# Patient Record
Sex: Female | Born: 1990 | Race: Black or African American | Hispanic: Yes | Marital: Married | Smoking: Never smoker
Health system: Southern US, Community
[De-identification: ages and names within clinical notes are randomized; demographics above are authoritative.]

## PROBLEM LIST (undated history)

## (undated) DIAGNOSIS — R51 Headache: Secondary | ICD-10-CM

## (undated) DIAGNOSIS — T7840XA Allergy, unspecified, initial encounter: Secondary | ICD-10-CM

## (undated) DIAGNOSIS — K219 Gastro-esophageal reflux disease without esophagitis: Secondary | ICD-10-CM

## (undated) DIAGNOSIS — R519 Headache, unspecified: Secondary | ICD-10-CM

## (undated) DIAGNOSIS — R011 Cardiac murmur, unspecified: Secondary | ICD-10-CM

## (undated) DIAGNOSIS — M1990 Unspecified osteoarthritis, unspecified site: Secondary | ICD-10-CM

## (undated) DIAGNOSIS — K589 Irritable bowel syndrome without diarrhea: Secondary | ICD-10-CM

## (undated) DIAGNOSIS — F445 Conversion disorder with seizures or convulsions: Secondary | ICD-10-CM

## (undated) DIAGNOSIS — R569 Unspecified convulsions: Secondary | ICD-10-CM

## (undated) HISTORY — DX: Headache: R51

## (undated) HISTORY — DX: Cardiac murmur, unspecified: R01.1

## (undated) HISTORY — DX: Irritable bowel syndrome, unspecified: K58.9

## (undated) HISTORY — DX: Unspecified osteoarthritis, unspecified site: M19.90

## (undated) HISTORY — DX: Allergy, unspecified, initial encounter: T78.40XA

## (undated) HISTORY — DX: Gastro-esophageal reflux disease without esophagitis: K21.9

## (undated) HISTORY — DX: Headache, unspecified: R51.9

---

## 2015-07-07 ENCOUNTER — Encounter: Payer: Self-pay | Admitting: *Deleted

## 2015-07-07 ENCOUNTER — Emergency Department
Admission: EM | Admit: 2015-07-07 | Discharge: 2015-07-08 | Disposition: A | Discharge status: Home / Self Care | Payer: No Typology Code available for payment source | Attending: Emergency Medicine | Admitting: Emergency Medicine

## 2015-07-07 DIAGNOSIS — R569 Unspecified convulsions: Secondary | ICD-10-CM | POA: Diagnosis not present

## 2015-07-07 DIAGNOSIS — R4182 Altered mental status, unspecified: Secondary | ICD-10-CM | POA: Diagnosis present

## 2015-07-07 LAB — CBC
HCT: 37.3 % (ref 35.0–47.0)
Hemoglobin: 11.8 g/dL — ABNORMAL LOW (ref 12.0–16.0)
MCH: 25.7 pg — ABNORMAL LOW (ref 26.0–34.0)
MCHC: 31.5 g/dL — ABNORMAL LOW (ref 32.0–36.0)
MCV: 81.8 fL (ref 80.0–100.0)
Platelets: 382 10*3/uL (ref 150–440)
RBC: 4.56 MIL/uL (ref 3.80–5.20)
RDW: 18.6 % — ABNORMAL HIGH (ref 11.5–14.5)
WBC: 11.8 10*3/uL — ABNORMAL HIGH (ref 3.6–11.0)

## 2015-07-07 MED ORDER — DIPHENHYDRAMINE HCL 50 MG/ML IJ SOLN
50.0000 mg | Freq: Once | INTRAMUSCULAR | Status: AC
  Administered 2015-07-08: 50 mg via INTRAVENOUS
  Filled 2015-07-07: qty 1

## 2015-07-07 NOTE — ED Provider Notes (Signed)
[facility] Emergency Department Provider Note  Time seen: 11:44 PM  I have reviewed the triage vital signs and the nursing notes.   HISTORY  Chief Complaint Altered Mental Status    HPI Ruth Haley is a 24 y.o. female with no past medical history status post NSVD 06/28/15 who presents the emergency department with seizure-like activity. According to the patient for the past 1-2 hours she has been having spasm-like episodes. While speaking with the patient she occasionally grimaces her face, and will have shaking like movements of her extremities although she is able to converse throughout the seizure-like activity. Patient states a similar event happened with her last child after she was given an epidural however states she did not have an epidural during this pregnancy. Patient states they never figured out what the issue was with her last child. States she has not had any issues since then, has been very normal including today until approximately 1-2 hours ago. Patient takes fenugreek for breastmilk supplementation, Motrin, and MiraLAX. Patient does breast-feed.     History reviewed. No pertinent past medical history.  There are no active problems to display for this patient.   History reviewed. No pertinent past surgical history.  No current outpatient prescriptions on file.  Allergies Review of patient's allergies indicates no known allergies.  History reviewed. No pertinent family history.  Social History Social History  Substance Use Topics  . Smoking status: Never Smoker   . Smokeless tobacco: None  . Alcohol Use: No    Review of Systems Constitutional: Negative for fever Cardiovascular: Negative for chest pain. Respiratory: Negative for shortness of breath. Gastrointestinal: Negative for abdominal pain Genitourinary: Negative for dysuria. Musculoskeletal: Negative for back pain. Neurological: Negative for headache 10-point ROS otherwise  negative.  ____________________________________________   PHYSICAL EXAM:  VITAL SIGNS: ED Triage Vitals  Enc Vitals Group     BP 07/07/15 2247 130/74 mmHg     Pulse Rate 07/07/15 2247 70     Resp --      Temp 07/07/15 2247 98.6 F (37 C)     Temp Source 07/07/15 2247 Oral     SpO2 07/07/15 2247 100 %     Weight 07/07/15 2247 250 lb (113.399 kg)     Height 07/07/15 2247 5\' 2"  (1.575 m)     Head Cir --      Peak Flow --      Pain Score --      Pain Loc --      Pain Edu? --      Excl. in GC? --     Constitutional: Alert and oriented. Well appearing and in no distress, however will occasionally start shaking in her extremities and tense up her muscles. Occasionally has difficulty speaking, she will relax to get deep breath and then continue speaking. Eyes: Normal exam, PERRL. ENT   Head: Normocephalic and atraumatic   Mouth/Throat: Mucous membranes are moist. Cardiovascular: Normal rate, regular rhythm. No murmur Respiratory: Normal respiratory effort without tachypnea nor retractions. Breath sounds are clear and equal bilaterally. No wheezes/rales/rhonchi. Gastrointestinal: Soft and nontender. No distention.  Musculoskeletal: Nontender with normal range of motion in all extremities. Neurologic:  At times the patient has normal fluent speech and language. Able to move all extremities. Good grip strengths bilaterally. At times patient will occasionally shake extremities, and tense up her muscles, with difficulty speaking, this resolves within several seconds and she feels back to normal. Skin:  Skin is warm, dry and intact.  Psychiatric: Mood and affect are normal. Speech and behavior are normal.   ____________________________________________    INITIAL IMPRESSION / ASSESSMENT AND PLAN / ED COURSE  Pertinent labs & imaging results that were available during my care of the patient were reviewed by me and considered in my medical decision making (see chart for  details).  Patient presents the emergency department with seizure-like activity for the last 1-2 hours. Patient recently delivered vaginally 06/28/15. No complications with the pregnancy or delivery per patient. Patient seizure-like activity is shaking her extremities, and then her muscles tense up and she has difficulty speaking. This lasts for several seconds and then goes away. The patient is able to talk and follow commands throughout the episodes. Does not appear to be consistent with seizure activity. Patient will occasionally have her one arm move that just to cover her mouth, she then grabs that arm with her other hand and pulls it off her mouth. Somewhat strange activity but appears to have control over her movements. We will check labs, dose IV Benadryl, and closely monitor in the emergency department. We will attempt to obtain records from [facility] which she delivered her child for any further information.  I reviewed the patient's records from [facility], do not see any indication in the notes of any seizure-like activity during this recent admission or her last admission for C-section in 2015. We will continue to closely monitor the patient while awaiting lab results.  Patient is feeling much better after Benadryl, is speaking without any difficulty. I discussed with the patient my desire for her to see his psychiatrist, at this time the patient states she does not wish to stay in the emergency department overnight as she has a newborn child at home. Patient's mother is here with the child who states she will take care of the patient at home, watch her closely and they will return if the patient has any further episodes. I do not believe the patient meets IVC criteria at this time, she is alert, well, and although she may benefit from speaking to his psychiatrist I do not believe that it is emergently necessary at this time. I discussed with the patient if any further episodes occur she is to return to the  emergency department, she is agreeable. Medically her labs have resulted in largely normal results. She is unable to give a urine at this time, and does not wish to wait to provide a urine sample. Patient does have a mildly elevated blood pressure, normal LFTs, normal platelets. I discussed with the patient the need to follow up with OB/GYN.  ____________________________________________   FINAL CLINICAL IMPRESSION(S) / ED DIAGNOSES  Non-epileptiform seizure activity   Minna Antis, MD 07/08/15 [phone]

## 2015-07-07 NOTE — ED Notes (Addendum)
Pt to ED via  EMS due to AMS at approx 10pm with seizure like activity. Per EMS upon arrival to home pt began to twitch and have seizure like activity, yet AAOx4 able to answer questions, neuro status fully intact. Upon arrival, pt still AAOX4, full body twitching but still able to talk and look at RN while twitching occuring. Pt gave birth 11/21 at [facility], vaginal birth with no complications. Per pt and mother, pt had "similar episode after last birth and the last epidural" Pt denies having epidural this past birth. Pt no medical hx, takes no meds. Vitals wnl at this time. Pt denies SI, HI, or hallucinations.

## 2015-07-08 LAB — COMPREHENSIVE METABOLIC PANEL
ALT: 17 U/L (ref 14–54)
AST: 22 U/L (ref 15–41)
Albumin: 3.2 g/dL — ABNORMAL LOW (ref 3.5–5.0)
Alkaline Phosphatase: 78 U/L (ref 38–126)
Anion gap: 8 (ref 5–15)
BUN: 17 mg/dL (ref 6–20)
CO2: 22 mmol/L (ref 22–32)
Calcium: 8.8 mg/dL — ABNORMAL LOW (ref 8.9–10.3)
Chloride: 110 mmol/L (ref 101–111)
Creatinine, Ser: 0.73 mg/dL (ref 0.44–1.00)
GFR calc Af Amer: >60 mL/min (ref >60)
GFR calc non Af Amer: >60 mL/min (ref >60)
Glucose, Bld: 89 mg/dL (ref 65–99)
Potassium: 3.8 mmol/L (ref 3.5–5.1)
Sodium: 140 mmol/L (ref 135–145)
Total Bilirubin: <0.1 mg/dL — ABNORMAL LOW (ref 0.3–1.2)
Total Protein: 6.4 g/dL — ABNORMAL LOW (ref 6.5–8.1)

## 2015-07-08 NOTE — Discharge Instructions (Signed)
Please follow-up with your OB/GYN physician as soon as possible for recheck. Return to the emergency department for any further episodes, or any other symptoms personally concerning to your self.    Nonepileptic Seizures Nonepileptic seizures are seizures that are not caused by abnormal electrical signals in your brain. These seizures often seem like epileptic seizures, but they are not caused by epilepsy.  There are two types of nonepileptic seizures:  A physiologic nonepileptic seizure results from a disruption in your brain.  A psychogenic seizure results from emotional stress. These seizures are sometimes called pseudoseizures. CAUSES  Causes of physiologic nonepileptic seizures include:   Sudden drop in blood pressure.  Low blood sugar.  Low levels of salt (sodium) in your blood.  Low levels of calcium in your blood.  Migraine.  Heart rhythm problems.  Sleep disorders.  Drug and alcohol abuse. Common causes of psychogenic nonepileptic seizures include:  Stress.  Emotional trauma.  Sexual or physical abuse.  Major life events, such as divorce or the death of a loved one.  Mental health disorders, including panic attack and hyperactivity disorder. SIGNS AND SYMPTOMS A nonepileptic seizure can look like an epileptic seizure, including uncontrollable shaking (convulsions), or changes in attention, behavior, or the ability to remain awake and alert. However, there are some differences. Nonepileptic seizures usually:  Do not cause physical injuries.  Start slowly.  Include crying or shrieking.  Last longer than 2 minutes.  Have a short recovery time without headache or exhaustion. DIAGNOSIS  Your health care provider can usually diagnose nonepileptic seizures after taking your medical history and giving you a physical exam. Your health care provider may want to talk to your friends or relatives who have seen you have a seizure.  You may also need to have tests to  look for causes of physiologic nonepileptic seizures. This may include an electroencephalogram (EEG), which is a test that measures electrical activity in your brain. If you have had an epileptic seizure, the results of your EEG will be abnormal. If your health care provider thinks you have had a psychogenic nonepileptic seizure, you may need to see a mental health specialist for an evaluation. TREATMENT  Treatment depends on the type and cause of your seizures.  For physiologic nonepileptic seizures, treatment is aimed at addressing the underlying condition that caused the seizures. These seizures usually stop when the underlying condition is properly treated.  Nonepileptic seizures do not respond to the seizure medicines used to treat epilepsy.  For psychogenic seizures, you may need to work with a mental health specialist. HOME CARE INSTRUCTIONS Home care will depend on the type of nonepileptic seizures you have.   Follow all your health care provider's instructions.  Keep all your follow-up appointments. SEEK MEDICAL CARE IF: You continue to have seizures after treatment. SEEK IMMEDIATE MEDICAL CARE IF:  Your seizures change or become more frequent.  You injure yourself during a seizure.  You have one seizure after another.  You have trouble recovering from a seizure.  You have chest pain or trouble breathing. MAKE SURE YOU:  Understand these instructions.  Will watch your condition.  Will get help right away if you are not doing well or get worse.   This information is not intended to replace advice given to you by your health care provider. Make sure you discuss any questions you have with your health care provider.   Document Released: 09/08/2005 Document Revised: 08/14/2014 Document Reviewed: 05/20/2013 Elsevier Interactive Patient Education [employer]2016 Elsevier Inc.

## 2015-10-22 ENCOUNTER — Other Ambulatory Visit: Payer: Self-pay | Admitting: Neurology

## 2015-10-22 DIAGNOSIS — R404 Transient alteration of awareness: Secondary | ICD-10-CM

## 2015-11-04 ENCOUNTER — Ambulatory Visit (INDEPENDENT_AMBULATORY_CARE_PROVIDER_SITE_OTHER): Payer: Managed Care, Other (non HMO) | Admitting: Licensed Clinical Social Worker

## 2015-11-04 ENCOUNTER — Encounter: Payer: Self-pay | Admitting: Licensed Clinical Social Worker

## 2015-11-04 DIAGNOSIS — F445 Conversion disorder with seizures or convulsions: Secondary | ICD-10-CM | POA: Insufficient documentation
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
## 2015-11-12 ENCOUNTER — Ambulatory Visit: Payer: Managed Care, Other (non HMO)

## 2015-12-09 ENCOUNTER — Ambulatory Visit: Payer: Managed Care, Other (non HMO) | Attending: Neurology

## 2017-10-23 ENCOUNTER — Ambulatory Visit: Payer: Self-pay | Admitting: Medical

## 2017-10-23 VITALS — BP 99/53 | HR 76 | Temp 97.9°F | Resp 18 | Ht 62.0 in | Wt 163.0 lb

## 2017-10-23 DIAGNOSIS — J0110 Acute frontal sinusitis, unspecified: Secondary | ICD-10-CM

## 2017-10-23 DIAGNOSIS — H6503 Acute serous otitis media, bilateral: Secondary | ICD-10-CM

## 2017-10-23 DIAGNOSIS — R05 Cough: Secondary | ICD-10-CM

## 2017-10-23 DIAGNOSIS — R059 Cough, unspecified: Secondary | ICD-10-CM

## 2017-10-23 DIAGNOSIS — J069 Acute upper respiratory infection, unspecified: Secondary | ICD-10-CM

## 2017-10-23 MED ORDER — BENZONATATE 100 MG PO CAPS: ORAL_CAPSULE | ORAL | 0 refills | Status: DC

## 2017-10-23 MED ORDER — AMOXICILLIN-POT CLAVULANATE 875-125 MG PO TABS: 1.0000 | ORAL_TABLET | Freq: Two times a day (BID) | ORAL | 0 refills | Status: DC

## 2017-10-23 NOTE — Patient Instructions (Signed)
Upper Respiratory Infection, Adult Most upper respiratory infections (URIs) are caused by a virus. A URI affects the nose, throat, and upper air passages. The most common type of URI is often called "the common cold." Follow these instructions at home:  Take medicines only as told by your doctor.  Gargle warm saltwater or take cough drops to comfort your throat as told by your doctor.  Use a warm mist humidifier or inhale steam from a shower to increase air moisture. This may make it easier to breathe.  Drink enough fluid to keep your pee (urine) clear or pale yellow.  Eat soups and other clear broths.  Have a healthy diet.  Rest as needed.  Go back to work when your fever is gone or your doctor says it is okay. ? You may need to stay home longer to avoid giving your URI to others. ? You can also wear a face mask and wash your hands often to prevent spread of the virus.  Use your inhaler more if you have asthma.  Do not use any tobacco products, including cigarettes, chewing tobacco, or electronic cigarettes. If you need help quitting, ask your doctor. Contact a doctor if:  You are getting worse, not better.  Your symptoms are not helped by medicine.  You have chills.  You are getting more short of breath.  You have brown or red mucus.  You have yellow or brown discharge from your nose.  You have pain in your face, especially when you bend forward.  You have a fever.  You have puffy (swollen) neck glands.  You have pain while swallowing.  You have white areas in the back of your throat. Get help right away if:  You have very bad or constant: ? Headache. ? Ear pain. ? Pain in your forehead, behind your eyes, and over your cheekbones (sinus pain). ? Chest pain.  You have long-lasting (chronic) lung disease and any of the following: ? Wheezing. ? Long-lasting cough. ? Coughing up blood. ? A change in your usual mucus.  You have a stiff neck.  You have  changes in your: ? Vision. ? Hearing. ? Thinking. ? Mood. This information is not intended to replace advice given to you by your health care provider. Make sure you discuss any questions you have with your health care provider. Document Released: 01/10/2008 Document Revised: 03/26/2016 Document Reviewed: 10/29/2013 Elsevier Interactive Patient Education  2018 [employer] Sinusitis, Adult Sinusitis is soreness and inflammation of your sinuses. Sinuses are hollow spaces in the bones around your face. They are located:  Around your eyes.  In the middle of your forehead.  Behind your nose.  In your cheekbones.  Your sinuses and nasal passages are lined with a stringy fluid (mucus). Mucus normally drains out of your sinuses. When your nasal tissues get inflamed or swollen, the mucus can get trapped or blocked so air cannot flow through your sinuses. This lets bacteria, viruses, and funguses grow, and that leads to infection. Follow these instructions at home: Medicines  Take, use, or apply over-the-counter and prescription medicines only as told by your doctor. These may include nasal sprays.  If you were prescribed an antibiotic medicine, take it as told by your doctor. Do not stop taking the antibiotic even if you start to feel better. Hydrate and Humidify  Drink enough water to keep your pee (urine) clear or pale yellow.  Use a cool mist humidifier to keep the humidity level in your home above   50%.  Breathe in steam for 10-15 minutes, 3-4 times a day or as told by your doctor. You can do this in the bathroom while a hot shower is running.  Try not to spend time in cool or dry air. Rest  Rest as much as possible.  Sleep with your head raised (elevated).  Make sure to get enough sleep each night. General instructions  Put a warm, moist washcloth on your face 3-4 times a day or as told by your doctor. This will help with discomfort.  Wash your hands often with soap and  water. If there is no soap and water, use hand sanitizer.  Do not smoke. Avoid being around people who are smoking (secondhand smoke).  Keep all follow-up visits as told by your doctor. This is important. Contact a doctor if:  You have a fever.  Your symptoms get worse.  Your symptoms do not get better within 10 days. Get help right away if:  You have a very bad headache.  You cannot stop throwing up (vomiting).  You have pain or swelling around your face or eyes.  You have trouble seeing.  You feel confused.  Your neck is stiff.  You have trouble breathing. This information is not intended to replace advice given to you by your health care provider. Make sure you discuss any questions you have with your health care provider. Document Released: 01/10/2008 Document Revised: 03/19/2016 Document Reviewed: 05/19/2015 Elsevier Interactive Patient Education  2018 [employer] Otitis Media, Adult Otitis media is redness, soreness, and puffiness (swelling) in the space just behind your eardrum (middle ear). It may be caused by allergies or infection. It often happens along with a cold. Follow these instructions at home:  Take your medicine as told. Finish it even if you start to feel better.  Only take over-the-counter or prescription medicines for pain, discomfort, or fever as told by your doctor.  Follow up with your doctor as told. Contact a doctor if:  You have otitis media only in one ear, or bleeding from your nose, or both.  You notice a lump on your neck.  You are not getting better in 3-5 days.  You feel worse instead of better. Get help right away if:  You have pain that is not helped with medicine.  You have puffiness, redness, or pain around your ear.  You get a stiff neck.  You cannot move part of your face (paralysis).  You notice that the bone behind your ear hurts when you touch it. This information is not intended to replace advice given to you by  your health care provider. Make sure you discuss any questions you have with your health care provider. Document Released: 01/10/2008 Document Revised: 12/30/2015 Document Reviewed: 02/18/2013 Elsevier Interactive Patient Education  2017 [employer]

## 2017-10-23 NOTE — Progress Notes (Signed)
   Subjective:    Patient ID: Ruth Haley, female    DOB: 10/30/1990, 27 y.o.   MRN: [id number]  HPI 27 yo female  In non acute. Comes in today with 3 weeks  with head cold , chest congestion and fatigue, out work for  4 days. . Household  With son and significant other with fever.  Continues with green productive cough , nasal discharge green , wetness in ears or trouble hearing.  Had fever last Wednesday at  101 next evening, no fever since then. No body aches.  Review of Systems  Constitutional: Positive for chills, fatigue and fever (101 last Wednesday).  HENT: Positive for congestion, ear discharge (both), ear pain (both right hurts more), rhinorrhea, sinus pressure, sinus pain (forehead), sneezing, sore throat and trouble swallowing (due to pain and mucus "a little bit"). Negative for tinnitus and voice change.   Eyes: Negative for discharge and itching.  Respiratory: Positive for cough. Negative for chest tightness, shortness of breath and wheezing.   Cardiovascular: Negative for chest pain.  Gastrointestinal: Negative for abdominal pain.  Genitourinary: Negative for dysuria.  Musculoskeletal: Negative for myalgias.  Skin: Negative for rash.  Allergic/Immunologic: Positive for environmental allergies (spring allergies fall) and food allergies (lactose intolerant).  Neurological: Positive for headaches. Negative for dizziness, syncope and light-headedness.  Hematological: Positive for adenopathy.  Psychiatric/Behavioral: Negative for behavioral problems, self-injury and suicidal ideas. The patient is not nervous/anxious.        Objective:   Physical Exam  Constitutional: She is oriented to person, place, and time. She appears well-developed and well-nourished.  HENT:  Head: Normocephalic and atraumatic.  Right Ear: Hearing, external ear and ear canal normal. Tympanic membrane is erythematous.  Left Ear: Hearing and ear canal normal. Tympanic membrane is erythematous.  Nose:  Mucosal edema and rhinorrhea present.  Mouth/Throat: Uvula is midline and mucous membranes are normal. Posterior oropharyngeal edema and posterior oropharyngeal erythema present.  Eyes: Conjunctivae and EOM are normal. Pupils are equal, round, and reactive to light.  Neck: Normal range of motion. Neck supple.  Cardiovascular: Normal rate, regular rhythm and normal heart sounds.  Pulmonary/Chest: Effort normal and breath sounds normal.  Lymphadenopathy:    She has cervical adenopathy.  Neurological: She is alert and oriented to person, place, and time.  Skin: Skin is warm and dry.  Psychiatric: She has a normal mood and affect. Her behavior is normal. Judgment and thought content normal.  Nursing note and vitals reviewed.   Cough noted in room.      Assessment & Plan:  Otitis media bilateral, Sinusitis, Upper Respiratory Infection Meds ordered this encounter  Medications  . benzonatate (TESSALON PERLES) 100 MG capsule    Sig: 1-2 perles swallow whole every 8 hours as needed for cough    Dispense:  30 capsule    Refill:  0  . amoxicillin-clavulanate (AUGMENTIN) 875-125 MG tablet    Sig: Take 1 tablet by mouth 2 (two) times daily.    Dispense:  20 tablet    Refill:  0  Increase fluids, use cough drops and OTC Motrin or Tylenol as needed for pain in throat, take as directed.Dilute salt water gargles.  Follow up in 3-5 days if not improving . Patient verbalizes understanding and has no questions at discharge.

## 2019-10-15 ENCOUNTER — Encounter: Payer: Self-pay | Admitting: Medical

## 2019-10-15 ENCOUNTER — Other Ambulatory Visit: Payer: Self-pay

## 2019-10-15 ENCOUNTER — Ambulatory Visit: Payer: Self-pay | Admitting: Medical

## 2019-10-15 VITALS — BP 121/70 | HR 47 | Temp 98.0°F | Resp 18 | Ht 62.0 in | Wt 176.0 lb

## 2019-10-15 DIAGNOSIS — J302 Other seasonal allergic rhinitis: Secondary | ICD-10-CM

## 2019-10-15 DIAGNOSIS — H6501 Acute serous otitis media, right ear: Secondary | ICD-10-CM

## 2019-10-15 DIAGNOSIS — H60501 Unspecified acute noninfective otitis externa, right ear: Secondary | ICD-10-CM

## 2019-10-15 DIAGNOSIS — H6983 Other specified disorders of Eustachian tube, bilateral: Secondary | ICD-10-CM

## 2019-10-15 MED ORDER — NEOMYCIN-POLYMYXIN-HC 3.5-10000-1 OT SOLN: 4.0000 [drp] | Freq: Four times a day (QID) | OTIC | 0 refills | Status: AC

## 2019-10-15 MED ORDER — AMOXICILLIN 875 MG PO TABS: 875.0000 mg | ORAL_TABLET | Freq: Two times a day (BID) | ORAL | 0 refills | Status: DC

## 2019-10-15 NOTE — Patient Instructions (Signed)
Otitis Externa  Otitis externa is an infection of the outer ear canal. The outer ear canal is the area between the outside of the ear and the eardrum. Otitis externa is sometimes called swimmer's ear. What are the causes? Common causes of this condition include:  Swimming in dirty water.  Moisture in the ear.  An injury to the inside of the ear.  An object stuck in the ear.  A cut or scrape on the outside of the ear. What increases the risk? You are more likely to get this condition if you go swimming often. What are the signs or symptoms?  Itching in the ear. This is often the first symptom.  Swelling of the ear.  Redness in the ear.  Ear pain. The pain may get worse when you pull on your ear.  Pus coming from the ear. How is this treated? This condition may be treated with:  Antibiotic ear drops. These are often given for 10-14 days.  Medicines to reduce itching and swelling. Follow these instructions at home:  If you were given antibiotic ear drops, use them as told by your doctor. Do not stop using them even if your condition gets better.  Take over-the-counter and prescription medicines only as told by your doctor.  Avoid getting water in your ears as told by your doctor. You may be told to avoid swimming or water sports for a few days.  Keep all follow-up visits as told by your doctor. This is important. How is this prevented?  Keep your ears dry. Use the corner of a towel to dry your ears after you swim or bathe.  Try not to scratch or put things in your ear. Doing these things makes it easier for germs to grow in your ear.  Avoid swimming in lakes, dirty water, or pools that may not have the right amount of a chemical called chlorine. Contact a doctor if:  You have a fever.  Your ear is still red, swollen, or painful after 3 days.  You still have pus coming from your ear after 3 days.  Your redness, swelling, or pain gets worse.  You have a really  bad headache.  You have redness, swelling, pain, or tenderness behind your ear. Summary  Otitis externa is an infection of the outer ear canal.  Symptoms include pain, redness, and swelling of the ear.  If you were given antibiotic ear drops, use them as told by your doctor. Do not stop using them even if your condition gets better.  Try not to scratch or put things in your ear. This information is not intended to replace advice given to you by your health care provider. Make sure you discuss any questions you have with your health care provider. Document Revised: 12/28/2017 Document Reviewed: 12/28/2017 Elsevier Patient Education  2020 [employer] Inc. Otitis Media, Adult  Otitis media means that the middle ear is red and swollen (inflamed) and full of fluid. The condition usually goes away on its own. Follow these instructions at home:  Take over-the-counter and prescription medicines only as told by your doctor.  If you were prescribed an antibiotic medicine, take it as told by your doctor. Do not stop taking the antibiotic even if you start to feel better.  Keep all follow-up visits as told by your doctor. This is important. Contact a doctor if:  You have bleeding from your nose.  There is a lump on your neck.  You are not getting better in 5   days.  You feel worse instead of better. Get help right away if:  You have pain that is not helped with medicine.  You have swelling, redness, or pain around your ear.  You get a stiff neck.  You cannot move part of your face (paralyzed).  You notice that the bone behind your ear hurts when you touch it.  You get a very bad headache. Summary  Otitis media means that the middle ear is red, swollen, and full of fluid.  This condition usually goes away on its own. In some cases, treatment may be needed.  If you were prescribed an antibiotic medicine, take it as told by your doctor. This information is not intended to replace  advice given to you by your health care provider. Make sure you discuss any questions you have with your health care provider. Document Revised: 07/06/2017 Document Reviewed: 08/14/2016 Elsevier Patient Education  2020 [employer].

## 2019-10-15 NOTE — Progress Notes (Signed)
   Subjective:    Patient ID: Ruth Haley, female    DOB: 02-23-1991, 29 y.o.   MRN: [id number]  HPI  29 yo female in non acute distress. History of  2-3 weeks of right ear pain, now the left side ear is painful and now a slight sore throat. No cough. Nasal congestion and nothing comes out of the nose.  History of seasonal allegies: Allergies reciewed with patient. Patient states she is not allergic to [facility] but her mother is .  Blood pressure 121/70, pulse (!) 47, temperature 98 F (36.7 C), resp. rate 18, height 5\' 2"  (1.575 m), weight 176 lb (79.8 kg), last menstrual period 10/15/2019, SpO2 100 %.  Review of Systems  HENT: Positive for ear pain. Negative for congestion, ear discharge, postnasal drip, sinus pressure, sinus pain and sore throat.   All other systems reviewed and are negative.      Objective:   Physical Exam HENT:     Head: Normocephalic and atraumatic.     Right Ear: External ear normal. There is no impacted cerumen.     Left Ear: Ear canal and external ear normal. There is no impacted cerumen.     Mouth/Throat:     Mouth: Mucous membranes are moist.     Pharynx: Posterior oropharyngeal erythema present. No oropharyngeal exudate.  Eyes:     Extraocular Movements: Extraocular movements intact.     Conjunctiva/sclera: Conjunctivae normal.     Pupils: Pupils are equal, round, and reactive to light.  Cardiovascular:     Rate and Rhythm: Normal rate and regular rhythm.  Pulmonary:     Effort: Pulmonary effort is normal.     Breath sounds: Normal breath sounds.  Musculoskeletal:        General: Normal range of motion.     Cervical back: Normal range of motion and neck supple.  Lymphadenopathy:     Cervical: No cervical adenopathy.  Skin:    General: Skin is warm and dry.     Capillary Refill: Capillary refill takes less than 2 seconds.  Neurological:     General: No focal deficit present.     Mental Status: She is oriented to person, place, and time.   Psychiatric:        Mood and Affect: Mood normal.        Behavior: Behavior normal.        Thought Content: Thought content normal.        Judgment: Judgment normal.           Assessment & Plan:  Otitis externa right Otitis media right Eustachian tube dysfunction bilateral Seasonal allergies OTC Zyrtec and OTC Flonase take as directed. Meds ordered this encounter  Medications  . neomycin-polymyxin-hydrocortisone (CORTISPORIN) OTIC solution    Sig: Place 4 drops into the right ear 4 (four) times daily for 7 days.    Dispense:  10 mL    Refill:  0  . amoxicillin (AMOXIL) 875 MG tablet    Sig: Take 1 tablet (875 mg total) by mouth 2 (two) times daily.    Dispense:  20 tablet    Refill:  0  return in 3-5 days if not improving. Patient verbalizes understanding and has no  questions at discharge.

## 2019-10-23 ENCOUNTER — Encounter: Payer: Self-pay | Admitting: Medical

## 2020-05-10 ENCOUNTER — Encounter: Payer: Self-pay | Admitting: Emergency Medicine

## 2020-05-10 ENCOUNTER — Emergency Department
Admission: EM | Admit: 2020-05-10 | Discharge: 2020-05-10 | Disposition: A | Discharge status: Home / Self Care | Payer: BC Managed Care – PPO | Attending: Emergency Medicine | Admitting: Emergency Medicine

## 2020-05-10 ENCOUNTER — Other Ambulatory Visit: Payer: Self-pay

## 2020-05-10 DIAGNOSIS — Y92838 Other recreation area as the place of occurrence of the external cause: Secondary | ICD-10-CM | POA: Insufficient documentation

## 2020-05-10 DIAGNOSIS — Y93B9 Activity, other involving muscle strengthening exercises: Secondary | ICD-10-CM | POA: Diagnosis not present

## 2020-05-10 DIAGNOSIS — W228XXA Striking against or struck by other objects, initial encounter: Secondary | ICD-10-CM | POA: Diagnosis not present

## 2020-05-10 DIAGNOSIS — S0993XA Unspecified injury of face, initial encounter: Secondary | ICD-10-CM | POA: Diagnosis not present

## 2020-05-10 DIAGNOSIS — S01511A Laceration without foreign body of lip, initial encounter: Secondary | ICD-10-CM | POA: Diagnosis not present

## 2020-05-10 DIAGNOSIS — Z87891 Personal history of nicotine dependence: Secondary | ICD-10-CM | POA: Diagnosis not present

## 2020-05-10 HISTORY — DX: Unspecified convulsions: R56.9

## 2020-05-10 MED ORDER — LIDOCAINE-EPINEPHRINE-TETRACAINE (LET) TOPICAL GEL
3.0000 mL | Freq: Once | TOPICAL | Status: AC
  Administered 2020-05-10: 3 mL via TOPICAL
  Filled 2020-05-10: qty 3

## 2020-05-10 NOTE — ED Notes (Signed)
ED Provider at bedside. 

## 2020-05-10 NOTE — ED Provider Notes (Signed)
[facility] Emergency Department Provider Note ____________________________________________   First MD Initiated Contact with Patient 05/10/20 [phone]     (approximate)  I have reviewed the triage vital signs and the nursing notes.  HISTORY  Chief Complaint Lip Laceration   HPI Ruth Haley is a 29 y.o. femalewho presents to the ED for evaluation of an accidental left laceration.  Chart review indicates no relevant medical history.  Takes no thinners.  Patient reports being in her typical state of health until accidentally striking herself in the face with a medicine ball during an exercise routine.  She reports this just happened last night.  She denies additional trauma, subsequent falls, syncope or head trauma beyond the medicine ball hitting her in the face/chin.  She reports a laceration to the mucosal margin of the lower lip, that is now hemostatic with triage gauze.   She reports her teeth feel aligned, she is able to mobilize her mandible with some right TMJ pain.  She reports aching right TMJ pain, 5/10 intensity with opening of her mouth that is constant and nonradiating.  She has not taken any medications prior to arrival.  Past Medical History:  Diagnosis Date  . Headache   . Seizures [facility])     Patient Active Problem List   Diagnosis Date Noted  . 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 CESAREAN SECTION      Prior to Admission medications   Medication Sig Start Date End Date Taking? Authorizing Provider  amoxicillin (AMOXIL) 875 MG tablet Take 1 tablet (875 mg total) by mouth 2 (two) times daily. 10/15/19   Ratcliffe, Heather R, PA-C  amoxicillin-clavulanate (AUGMENTIN) 875-125 MG tablet Take 1 tablet by mouth 2 (two) times daily. 10/23/17   Ratcliffe, Heather R, PA-C  benzonatate (TESSALON PERLES) 100 MG capsule 1-2 perles  swallow whole every 8 hours as needed for cough 10/23/17   Ratcliffe, Heather R, PA-C  PARAGARD INTRAUTERINE COPPER IUD IUD 1 Product by Intrauterine route See admin instructions.    [provider]    Allergies Adhesive [tape] and Mangifera indica  Family History  Problem Relation Age of Onset  . Crohn's disease Father   . Colitis Father   . Congenital heart disease Father   . Leukemia Father   . Arthritis/Rheumatoid Father   . Lung disease Father     Social History Social History   Tobacco Use  . Smoking status: Former Games developer  . Smokeless tobacco: Never Used  Substance Use Topics  . Alcohol use: No  . Drug use: No    Review of Systems  Constitutional: No fever/chills Eyes: No visual changes. ENT: No sore throat.  Positive for intraoral trauma and TMJ pain Cardiovascular: Denies chest pain. Respiratory: Denies shortness of breath. Gastrointestinal: No abdominal pain.  No nausea, no vomiting.  No diarrhea.  No constipation. Genitourinary: Negative for dysuria. Musculoskeletal: Negative for back pain. Skin: Negative for rash. Neurological: Negative for headaches, focal weakness or numbness.  ____________________________________________   PHYSICAL EXAM:  VITAL SIGNS: Vitals:   05/10/20 0649 05/10/20 0956  BP: 118/86 121/78  Pulse: 86 87  Resp: 18 16  Temp: (!) 97.5 F (36.4 C)   SpO2: 99% 100%      Constitutional: Alert and oriented. Well appearing and in no acute distress. Eyes: Conjunctivae are normal. PERRL. EOMI. Head: Scattered superficial abrasions to the chin/mentum.  No bony  step-offs or discrete laceration externally. Mild and poorly localizing right TMJ tenderness without overlying skin changes or signs of trauma.  No bony step-offs.  Patient is able to widely open her mouth, but reports right TMJ discomfort while doing this. Nose: No congestion/rhinnorhea. Mouth/Throat: Mucous membranes are moist.  Oropharynx non-erythematous. Isolated  single laceration to the mucosal portion of the lower lip, obliquely oriented, about 1 cm in length and gaping.  Not full-thickness, no external lacerations.  No other intraoral lesions noted.  Her teeth are well aligned.  No loose teeth. Neck: No stridor. No cervical spine tenderness to palpation. Cardiovascular: Normal rate, regular rhythm. Grossly normal heart sounds.  Good peripheral circulation. Respiratory: Normal respiratory effort.  No retractions. Lungs CTAB. Gastrointestinal: Soft , nondistended, nontender to palpation. No abdominal bruits. No CVA tenderness. Musculoskeletal: No lower extremity tenderness nor edema.  No joint effusions. No signs of acute trauma. Neurologic:  Normal speech and language. No gross focal neurologic deficits are appreciated. No gait instability noted. Skin:  Skin is warm, dry and intact. No rash noted. Psychiatric: Mood and affect are normal. Speech and behavior are normal. ____________________________________________   PROCEDURES and INTERVENTIONS  Procedure(s) performed (including Critical Care):  Marland KitchenMarland KitchenLaceration Repair  Date/Time: 05/10/2020 11:45 AM Performed by: Delton Prairie, MD Authorized by: Delton Prairie, MD   Consent:    Consent obtained:  Verbal   Consent given by:  Patient   Risks discussed:  Pain, poor cosmetic result and infection   Alternatives discussed:  No treatment Anesthesia (see MAR for exact dosages):    Anesthesia method:  Topical application   Topical anesthetic:  LET Laceration details:    Location:  Lip   Lip location:  Lower interior lip   Length (cm):  1 Repair type:    Repair type:  Simple Exploration:    Hemostasis achieved with:  Direct pressure   Wound exploration: wound explored through full range of motion     Contaminated: no   Treatment:    Amount of cleaning:  Standard   Visualized foreign bodies/material removed: no   Skin repair:    Repair method:  Sutures   Suture size:  5-0   Wound skin closure  material used: monocryl.   Suture technique:  Simple interrupted   Number of sutures:  1 Post-procedure details:    Dressing:  Open (no dressing)   Patient tolerance of procedure:  Tolerated well, no immediate complications    Medications  lidocaine-EPINEPHrine-tetracaine (LET) topical gel (3 mLs Topical Given 05/10/20 0920)    ____________________________________________   MDM / ED COURSE  Otherwise healthy 29 year old female presents to the ED for evaluation after accidentally striking her face with a exercise medicine ball, causing a lip laceration requiring single suture for repair, and amenable to outpatient management.  Normal vital signs on room air.  Exam demonstrates laceration to the mucosal portion of her lower lip, that is gaping and requires suture for reapproximation.  No evidence of full-thickness laceration.  While she has some vague and poorly localizing tenderness around her right TMJ, she has no focal bony tenderness, teeth malalignment, or impaired ROM.  We discussed the possibility of possible nondisplaced fracture there, she declines x-rays as this would not change her management.  We discussed outpatient management as if she does have a possible mandibular fracture, such as soft foods and Tylenol/ibuprofen.  Lip laceration repaired, as above, with single observable suture, and this was well-tolerated.  Provided return precautions for the ED and patient is medically  stable for discharge home.  Clinical Course as of May 10 1609  Mon May 10, 2020  4128 Lac repair complete   [DS]    Clinical Course User Index [DS] Delton Prairie, MD     ____________________________________________   FINAL CLINICAL IMPRESSION(S) / ED DIAGNOSES  Final diagnoses:  Lip laceration, initial encounter  Facial trauma, initial encounter     ED Discharge Orders    None       Larene Ascencio Katrinka Blazing   Note:  This document was prepared using Dragon voice recognition software and may include  unintentional dictation errors.   Delton Prairie, MD 05/10/20 [phone]

## 2020-05-10 NOTE — ED Triage Notes (Signed)
Patient states that she bounces a medicine ball and it hit her in the face. Patient with laceration to lip and abrasion to chin.

## 2020-05-10 NOTE — ED Notes (Signed)
Pt to ED For lac to lip. Lip is swollen, bleeding controlled at this time.

## 2020-05-10 NOTE — Discharge Instructions (Addendum)
Please take Tylenol and ibuprofen/Advil for your pain.  It is safe to take them together, or to alternate them every few hours.  Take up to 1000mg  of Tylenol at a time, up to 4 times per day.  Do not take more than 4000 mg of Tylenol in 24 hours.  For ibuprofen, take 400-600 mg, 4-5 times per day.  The single stitch that we put into your lip will absorb on its own and does not need to be removed.  If you develop the inability to open/close your mouth due to jaw pain, fevers or pus coming from your wounds, please return to the ED.

## 2020-05-31 ENCOUNTER — Other Ambulatory Visit: Payer: Self-pay

## 2020-05-31 ENCOUNTER — Encounter: Payer: Self-pay | Admitting: Medical

## 2020-05-31 ENCOUNTER — Ambulatory Visit: Payer: Self-pay | Admitting: Medical

## 2020-05-31 VITALS — BP 122/78 | HR 89 | Temp 98.3°F | Resp 18 | Ht 62.0 in | Wt 184.4 lb

## 2020-05-31 DIAGNOSIS — H6122 Impacted cerumen, left ear: Secondary | ICD-10-CM

## 2020-05-31 NOTE — Progress Notes (Signed)
   Subjective:    Patient ID: Ruth Haley, female    DOB: 04-30-1991, 29 y.o.   MRN: [id number]  HPI  29 yo in non acute distress. Currently being treated for Concussion and  Right infected ear with Ofloxacin Otic drops. Left  Ear is muffled at times or has no hearing , she wonders if it is wax or another ear infection. Sinus symptoms children sick with runny nose and cough and generally not feeling well, the youngest is runny nose and PND.  Patients sinus symptoms of congestion began on Saturday. She denies any recen or knownt exposure to Covid-19  infection    Revieiwed allergies with patient  Mother with anaphylaxis to [facility] Patient is alllergic to Morphine and tape adhesive  Review of Systems  Constitutional: Negative for chills and fever.  HENT: Positive for congestion and ear discharge (currently being treated for infection with ofloxacin ). Negative for ear pain (loss of hearing left on/ off), postnasal drip, rhinorrhea and sore throat.   Respiratory: Negative for cough, shortness of breath and wheezing.   Cardiovascular: Negative for chest pain.  Gastrointestinal: Negative for abdominal pain, diarrhea, nausea and vomiting.  Musculoskeletal: Negative for myalgias.  Allergic/Immunologic: Positive for environmental allergies.  Neurological: Positive for headaches (currently being treated for concussion being treated by her doctor.).       Objective:   Physical Exam Constitutional:      Appearance: Normal appearance.  HENT:     Head: Normocephalic and atraumatic.     Right Ear: No swelling.     Left Ear: External ear normal. There is impacted cerumen.  Eyes:     Comments: Wearing sunglasses  Musculoskeletal:     Cervical back: Normal range of motion.  Neurological:     Mental Status: She is alert.           Assessment & Plan:  Cerumen impaction left- reassured patient. Recommended OTC  Debrox Otic drops  5-10 drops twice daily x 4 days , return on Thursday for  ear irrigation. Patient verbalizes understanding and has no questions at discharge.

## 2020-05-31 NOTE — Patient Instructions (Signed)
Earwax Buildup, Adult The ears produce a substance called earwax that helps keep bacteria out of the ear and protects the skin in the ear canal. Occasionally, earwax can build up in the ear and cause discomfort or hearing loss. What increases the risk? This condition is more likely to develop in people who:  Are female.  Are elderly.  Naturally produce more earwax.  Clean their ears often with cotton swabs.  Use earplugs often.  Use in-ear headphones often.  Wear hearing aids.  Have narrow ear canals.  Have earwax that is overly thick or sticky.  Have eczema.  Are dehydrated.  Have excess hair in the ear canal. What are the signs or symptoms? Symptoms of this condition include:  Reduced or muffled hearing.  A feeling of fullness in the ear or feeling that the ear is plugged.  Fluid coming from the ear.  Ear pain.  Ear itch.  Ringing in the ear.  Coughing.  An obvious piece of earwax that can be seen inside the ear canal. How is this diagnosed? This condition may be diagnosed based on:  Your symptoms.  Your medical history.  An ear exam. During the exam, your health care provider will look into your ear with an instrument called an otoscope. You may have tests, including a hearing test. How is this treated? This condition may be treated by:  Using ear drops to soften the earwax.  Having the earwax removed by a health care provider. The health care provider may: ? Flush the ear with water. ? Use an instrument that has a loop on the end (curette). ? Use a suction device.  Surgery to remove the wax buildup. This may be done in severe cases. Follow these instructions at home:   Take over-the-counter and prescription medicines only as told by your health care provider.  Do not put any objects, including cotton swabs, into your ear. You can clean the opening of your ear canal with a washcloth or facial tissue.  Follow instructions from your health care  provider about cleaning your ears. Do not over-clean your ears.  Drink enough fluid to keep your urine clear or pale yellow. This will help to thin the earwax.  Keep all follow-up visits as told by your health care provider. If earwax builds up in your ears often or if you use hearing aids, consider seeing your health care provider for routine, preventive ear cleanings. Ask your health care provider how often you should schedule your cleanings.  If you have hearing aids, clean them according to instructions from the manufacturer and your health care provider. Contact a health care provider if:  You have ear pain.  You develop a fever.  You have blood, pus, or other fluid coming from your ear.  You have hearing loss.  You have ringing in your ears that does not go away.  Your symptoms do not improve with treatment.  You feel like the room is spinning (vertigo). Summary  Earwax can build up in the ear and cause discomfort or hearing loss.  The most common symptoms of this condition include reduced or muffled hearing and a feeling of fullness in the ear or feeling that the ear is plugged.  This condition may be diagnosed based on your symptoms, your medical history, and an ear exam.  This condition may be treated by using ear drops to soften the earwax or by having the earwax removed by a health care provider.  Do not put any   objects, including cotton swabs, into your ear. You can clean the opening of your ear canal with a washcloth or facial tissue. This information is not intended to replace advice given to you by your health care provider. Make sure you discuss any questions you have with your health care provider. Document Revised: 07/06/2017 Document Reviewed: 10/04/2016 Elsevier Patient Education  2020 Elsevier Inc.  

## 2020-06-03 ENCOUNTER — Ambulatory Visit: Payer: Self-pay | Admitting: Nurse Practitioner

## 2020-06-03 ENCOUNTER — Other Ambulatory Visit: Payer: Self-pay

## 2020-06-03 VITALS — BP 122/78 | HR 60 | Temp 97.1°F | Resp 16

## 2020-06-03 DIAGNOSIS — H6122 Impacted cerumen, left ear: Secondary | ICD-10-CM

## 2020-06-03 NOTE — Progress Notes (Signed)
   Subjective:    Patient ID: Ruth Haley, female    DOB: 08/24/1990, 29 y.o.   MRN: [id number]  HPI 29 year old female here for examination of left ear canal after using Debrox for two days. She was evaluated prior at [facility] and found to have cerumen impaction on the left. She is currently under treatment for a right ear canal infection and is using abx drops given to her by her PCP.   She also noted that she had two dissolvable stiches placed to her lower lip in the ED after being hit in the face by a medicine ball. She still feels a lump in her lower lip with decreased sensation to her lip.      Review of Systems  Constitutional: Negative.   HENT: Positive for ear discharge.   Respiratory: Negative.   Cardiovascular: Negative.        Objective:   Physical Exam Constitutional:      Appearance: Normal appearance.  HENT:     Right Ear: Tympanic membrane, ear canal and external ear normal.     Left Ear: Hearing and tympanic membrane normal. Drainage present.  Neurological:     Mental Status: She is alert.           Assessment & Plan:  Ear irragation performed by RN, relief of remaining cerumen TM intact patient tolerated well. Advised she may continue Debrox daily as needed prior to showering.  RTC with any new symptoms or concerns. Avoid Qtips

## 2020-08-09 ENCOUNTER — Encounter: Payer: Self-pay | Admitting: Medical

## 2020-08-09 ENCOUNTER — Other Ambulatory Visit: Payer: Self-pay

## 2020-08-09 ENCOUNTER — Ambulatory Visit: Payer: Self-pay | Admitting: Medical

## 2020-08-09 VITALS — BP 124/88 | HR 83 | Temp 98.1°F | Resp 16 | Ht 62.0 in | Wt 189.8 lb

## 2020-08-09 DIAGNOSIS — H6506 Acute serous otitis media, recurrent, bilateral: Secondary | ICD-10-CM

## 2020-08-09 DIAGNOSIS — J029 Acute pharyngitis, unspecified: Secondary | ICD-10-CM

## 2020-08-09 LAB — POCT RAPID STREP A (OFFICE): Rapid Strep A Screen: NEGATIVE

## 2020-08-09 MED ORDER — AZITHROMYCIN 250 MG PO TABS: ORAL_TABLET | ORAL | 0 refills | Status: DC

## 2020-08-09 NOTE — Patient Instructions (Signed)
Otitis Externa  Otitis externa is an infection of the outer ear canal. The outer ear canal is the area between the outside of the ear and the eardrum. Otitis externa is sometimes called swimmer's ear. What are the causes? Common causes of this condition include:  Swimming in dirty water.  Moisture in the ear.  An injury to the inside of the ear.  An object stuck in the ear.  A cut or scrape on the outside of the ear. What increases the risk? You are more likely to get this condition if you go swimming often. What are the signs or symptoms?  Itching in the ear. This is often the first symptom.  Swelling of the ear.  Redness in the ear.  Ear pain. The pain may get worse when you pull on your ear.  Pus coming from the ear. How is this treated? This condition may be treated with:  Antibiotic ear drops. These are often given for 10-14 days.  Medicines to reduce itching and swelling. Follow these instructions at home:  If you were given antibiotic ear drops, use them as told by your doctor. Do not stop using them even if your condition gets better.  Take over-the-counter and prescription medicines only as told by your doctor.  Avoid getting water in your ears as told by your doctor. You may be told to avoid swimming or water sports for a few days.  Keep all follow-up visits as told by your doctor. This is important. How is this prevented?  Keep your ears dry. Use the corner of a towel to dry your ears after you swim or bathe.  Try not to scratch or put things in your ear. Doing these things makes it easier for germs to grow in your ear.  Avoid swimming in lakes, dirty water, or pools that may not have the right amount of a chemical called chlorine. Contact a doctor if:  You have a fever.  Your ear is still red, swollen, or painful after 3 days.  You still have pus coming from your ear after 3 days.  Your redness, swelling, or pain gets worse.  You have a really  bad headache.  You have redness, swelling, pain, or tenderness behind your ear. Summary  Otitis externa is an infection of the outer ear canal.  Symptoms include pain, redness, and swelling of the ear.  If you were given antibiotic ear drops, use them as told by your doctor. Do not stop using them even if your condition gets better.  Try not to scratch or put things in your ear. This information is not intended to replace advice given to you by your health care provider. Make sure you discuss any questions you have with your health care provider. Document Revised: 12/28/2017 Document Reviewed: 12/28/2017 Elsevier Patient Education  2020 [employer]. Otitis Media, Adult  Otitis media means that the middle ear is red and swollen (inflamed) and full of fluid. The condition usually goes away on its own. Follow these instructions at home:  Take over-the-counter and prescription medicines only as told by your doctor.  If you were prescribed an antibiotic medicine, take it as told by your doctor. Do not stop taking the antibiotic even if you start to feel better.  Keep all follow-up visits as told by your doctor. This is important. Contact a doctor if:  You have bleeding from your nose.  There is a lump on your neck.  You are not getting better in 5  days.  You feel worse instead of better. Get help right away if:  You have pain that is not helped with medicine.  You have swelling, redness, or pain around your ear.  You get a stiff neck.  You cannot move part of your face (paralyzed).  You notice that the bone behind your ear hurts when you touch it.  You get a very bad headache. Summary  Otitis media means that the middle ear is red, swollen, and full of fluid.  This condition usually goes away on its own. In some cases, treatment may be needed.  If you were prescribed an antibiotic medicine, take it as told by your doctor. This information is not intended to replace  advice given to you by your health care provider. Make sure you discuss any questions you have with your health care provider. Document Revised: 07/06/2017 Document Reviewed: 08/14/2016 Elsevier Patient Education  2020 [employer].

## 2020-08-09 NOTE — Progress Notes (Signed)
   Subjective:    Patient ID: Ruth Haley, female    DOB: [birth year], 30 y.o.   MRN: [id number]  HPI 30 yo female in non acute distress. Treated for ear infection 06/28/2020 seen by PCP,   Finished Amoxil and Ofxfloxin treatment Seemed to improve. Started symptoms again  08/01/2020.   Tonsils feel big but do not hurt.  Blood pressure 124/88, pulse 83, temperature 98.1 F (36.7 C), temperature source Oral, resp. rate 16, height 5\' 2"  (1.575 m), weight 189 lb 12.8 oz (86.1 kg), last menstrual period 07/07/2020, SpO2 97 %.    Current Outpatient Medications:  .  azithromycin (ZITHROMAX Z-PAK) 250 MG tablet, Take 2 tablets by mouth today then one tablet days 2-5. Take with food., Disp: 6 each, Rfl: 0 .  benzonatate (TESSALON PERLES) 100 MG capsule, 1-2 perles swallow whole every 8 hours as needed for cough (Patient not taking: No sig reported), Disp: 30 capsule, Rfl: 0 .  PARAGARD INTRAUTERINE COPPER IUD IUD, 1 Product by Intrauterine route See admin instructions., Disp: , Rfl:     Allergies  Allergen Reactions  . Mangifera Indica Itching    Mother has the allergy - anaphylaxis  . Morphine And Related     Feels like brain is on fire  . Adhesive [Tape]     Review of Systems  Constitutional: Negative for chills and fever.  HENT: Positive for ear discharge (right) and ear pain (both).   Respiratory: Negative for cough, shortness of breath and wheezing.   Cardiovascular: Negative for chest pain.  Musculoskeletal: Negative for myalgias.       Objective:   Physical Exam Constitutional:      Appearance: Normal appearance.  HENT:     Head: Normocephalic and atraumatic.     Jaw: There is normal jaw occlusion.     Right Ear: Hearing and external ear normal. Swelling present. A middle ear effusion is present. There is impacted cerumen (looks like wax is on the TM, not impacated). No hemotympanum. Tympanic membrane is not injected.     Left Ear: Hearing, ear canal and external ear  normal. No swelling. A middle ear effusion is present. There is impacted cerumen (looks like wax is on the TM not impacted). No hemotympanum. Tympanic membrane is not injected.     Mouth/Throat:     Pharynx: Posterior oropharyngeal erythema present. No oropharyngeal exudate.  Eyes:     Extraocular Movements: Extraocular movements intact.     Conjunctiva/sclera: Conjunctivae normal.     Pupils: Pupils are equal, round, and reactive to light.  Musculoskeletal:     Cervical back: Normal range of motion and neck supple.  Neurological:     Mental Status: She is alert.     STREP Test Negative      Assessment & Plan:  Otitis Media bilateral cerumen noted at the back of canal. Otitis externa right ear canal Continue to use Ofloxin otic drops as prescribed. Referral completed for  [facility] due to 2 ear infections in November and then again today. Meds ordered this encounter  Medications  . azithromycin (ZITHROMAX Z-PAK) 250 MG tablet    Sig: Take 2 tablets by mouth today then one tablet days 2-5. Take with food.    Dispense:  6 each    Refill:  0   Patient verbalizes understanding and has no questions at discharge.

## 2020-11-24 ENCOUNTER — Encounter: Payer: Self-pay | Admitting: Medical

## 2020-11-24 ENCOUNTER — Ambulatory Visit: Payer: BC Managed Care – PPO | Admitting: Medical

## 2020-11-24 ENCOUNTER — Other Ambulatory Visit: Payer: Self-pay

## 2020-11-24 VITALS — BP 110/74 | HR 92 | Temp 98.1°F | Resp 16

## 2020-11-24 DIAGNOSIS — R0981 Nasal congestion: Secondary | ICD-10-CM

## 2020-11-24 DIAGNOSIS — Z20822 Contact with and (suspected) exposure to covid-19: Secondary | ICD-10-CM

## 2020-11-24 DIAGNOSIS — H6502 Acute serous otitis media, left ear: Secondary | ICD-10-CM

## 2020-11-24 LAB — POC COVID19 BINAXNOW: SARS Coronavirus 2 Ag: NEGATIVE

## 2020-11-24 MED ORDER — AMOXICILLIN 875 MG PO TABS: 875.0000 mg | ORAL_TABLET | Freq: Two times a day (BID) | ORAL | 0 refills | Status: DC

## 2020-11-24 NOTE — Progress Notes (Signed)
   Subjective:    Patient ID: Ruth Haley, female    DOB: 12/05/1990, 30 y.o.   MRN: [id number]  HPI 30 yo female in non acute distress presents to day with complaints of ST starting on Monday.. Cold symptoms x 2 weeks assumed it was allergies,  Congestion facial pain and HA.  Thursday Negative Covid-19 at home.  Side of left tonsil  When she swallows it hurts her left ear.  Saw her ENT in January, for" a dbl ear infection"   Blood pressure 110/74, pulse 92, temperature 98.1 F (36.7 C), temperature source Oral, resp. rate 16, last menstrual period 11/24/2020, SpO2 98 %.  Allergies  Allergen Reactions  . Mangifera Indica Itching    Mother has the allergy - anaphylaxis  . Morphine And Related     Feels like brain is on fire  . Adhesive [Tape]     Review of Systems  Constitutional: Negative for chills and fever.  HENT: Positive for ear pain and sore throat (pain on swallowing left side only). Negative for ear discharge and sinus pressure.   Respiratory: Negative for cough.   Cardiovascular: Negative for chest pain.  Gastrointestinal: Negative for abdominal pain and diarrhea.  Allergic/Immunologic: Positive for environmental allergies.  Neurological: Negative for dizziness, light-headedness and headaches.       Objective:   Physical Exam Vitals and nursing note reviewed.  Constitutional:      Appearance: She is well-developed.  HENT:     Head: Normocephalic and atraumatic.     Right Ear: Tympanic membrane and ear canal normal.     Left Ear: Tympanic membrane and ear canal normal.     Mouth/Throat:     Mouth: Mucous membranes are moist. No oral lesions.     Pharynx: Uvula midline. Pharyngeal swelling (mild) and posterior oropharyngeal erythema present. No oropharyngeal exudate or uvula swelling.     Tonsils: 1+ on the right. 1+ on the left.  Eyes:     Conjunctiva/sclera: Conjunctivae normal.     Pupils: Pupils are equal, round, and reactive to light.  Pulmonary:      Effort: Pulmonary effort is normal.  Musculoskeletal:     Cervical back: Normal range of motion and neck supple.  Skin:    General: Skin is warm and dry.     Capillary Refill: Capillary refill takes less than 2 seconds.  Neurological:     General: No focal deficit present.     Mental Status: She is alert and oriented to person, place, and time.  Psychiatric:        Mood and Affect: Mood normal.        Behavior: Behavior normal.          Assessment & Plan:  Tonsillitis Otalgia left Meds ordered this encounter  Medications  . amoxicillin (AMOXIL) 875 MG tablet    Sig: Take 1 tablet (875 mg total) by mouth 2 (two) times daily.    Dispense:  20 tablet    Refill:  0  OTC Motrin (Ibuprofen)  800 mg ( 4 200mg  tablets) three times a day with food. Soft foods nothing acidic.  Return in 3-5 days if not improving or if worsening. Patient verbalizes understanding and has no questions at discharge.

## 2020-11-24 NOTE — Patient Instructions (Signed)
Otitis Media, Adult  Otitis media is a condition in which the middle ear is red and swollen (inflamed) and full of fluid. The middle ear is the part of the ear that contains bones for hearing as well as air that helps send sounds to the brain. The condition usually goes away on its own. What are the causes? This condition is caused by a blockage in the eustachian tube. The eustachian tube connects the middle ear to the back of the nose. It normally allows air into the middle ear. The blockage is caused by fluid or swelling. Problems that can cause blockage include:  A cold or infection that affects the nose, mouth, or throat.  Allergies.  An irritant, such as tobacco smoke.  Adenoids that have become large. The adenoids are soft tissue located in the back of the throat, behind the nose and the roof of the mouth.  Growth or swelling in the upper part of the throat, just behind the nose (nasopharynx).  Damage to the ear caused by change in pressure. This is called barotrauma. What are the signs or symptoms? Symptoms of this condition include:  Ear pain.  Fever.  Problems with hearing.  Being tired.  Fluid leaking from the ear.  Ringing in the ear. How is this treated? This condition can go away on its own within 3-5 days. But if the condition is caused by bacteria or does not go away on its own, or if it keeps coming back, your doctor may:  Give you antibiotic medicines.  Give you medicines for pain. Follow these instructions at home:  Take over-the-counter and prescription medicines only as told by your doctor.  If you were prescribed an antibiotic medicine, take it as told by your doctor. Do not stop taking the antibiotic even if you start to feel better.  Keep all follow-up visits as told by your doctor. This is important. Contact a doctor if:  You have bleeding from your nose.  There is a lump on your neck.  You are not feeling better in 5 days.  You feel worse  instead of better. Get help right away if:  You have pain that is not helped with medicine.  You have swelling, redness, or pain around your ear.  You get a stiff neck.  You cannot move part of your face (paralysis).  You notice that the bone behind your ear hurts when you touch it.  You get a very bad headache. Summary  Otitis media means that the middle ear is red, swollen, and full of fluid.  This condition usually goes away on its own.  If the problem does not go away, treatment may be needed. You may be given medicines to treat the infection or to treat your pain.  If you were prescribed an antibiotic medicine, take it as told by your doctor. Do not stop taking the antibiotic even if you start to feel better.  Keep all follow-up visits as told by your doctor. This is important. This information is not intended to replace advice given to you by your health care provider. Make sure you discuss any questions you have with your health care provider. Document Revised: 06/26/2019 Document Reviewed: 06/26/2019 Elsevier Patient Education  2021 [employer].  

## 2020-11-25 LAB — NOVEL CORONAVIRUS, NAA: SARS-CoV-2, NAA: NOT DETECTED

## 2020-11-25 LAB — SARS-COV-2, NAA 2 DAY TAT

## 2020-11-29 ENCOUNTER — Telehealth: Payer: Self-pay | Admitting: Medical

## 2020-11-29 NOTE — Telephone Encounter (Signed)
Patient placed on Amoxil for Tonsillitis.  She states she is feeling "much much better" and is doing well. Return to the clinic as needed.  Patient verbalizes understanding and has no questions at the end of our conversation.

## 2021-07-13 ENCOUNTER — Other Ambulatory Visit: Payer: Self-pay | Admitting: Nurse Practitioner

## 2021-07-13 DIAGNOSIS — R198 Other specified symptoms and signs involving the digestive system and abdomen: Secondary | ICD-10-CM

## 2021-07-13 DIAGNOSIS — K582 Mixed irritable bowel syndrome: Secondary | ICD-10-CM

## 2021-07-13 DIAGNOSIS — Z8041 Family history of malignant neoplasm of ovary: Secondary | ICD-10-CM

## 2021-07-13 DIAGNOSIS — K625 Hemorrhage of anus and rectum: Secondary | ICD-10-CM

## 2021-07-13 DIAGNOSIS — Z8379 Family history of other diseases of the digestive system: Secondary | ICD-10-CM

## 2021-07-13 DIAGNOSIS — R1084 Generalized abdominal pain: Secondary | ICD-10-CM

## 2021-07-14 ENCOUNTER — Ambulatory Visit
Admission: RE | Admit: 2021-07-14 | Discharge: 2021-07-14 | Disposition: A | Discharge status: Home / Self Care | Payer: BC Managed Care – PPO | Source: Ambulatory Visit | Attending: Nurse Practitioner | Admitting: Nurse Practitioner

## 2021-07-14 ENCOUNTER — Other Ambulatory Visit: Payer: Self-pay

## 2021-07-14 DIAGNOSIS — K582 Mixed irritable bowel syndrome: Secondary | ICD-10-CM | POA: Diagnosis present

## 2021-07-14 DIAGNOSIS — Z8379 Family history of other diseases of the digestive system: Secondary | ICD-10-CM

## 2021-07-14 DIAGNOSIS — Z8041 Family history of malignant neoplasm of ovary: Secondary | ICD-10-CM | POA: Diagnosis present

## 2021-07-14 DIAGNOSIS — K625 Hemorrhage of anus and rectum: Secondary | ICD-10-CM | POA: Diagnosis present

## 2021-07-14 DIAGNOSIS — R1084 Generalized abdominal pain: Secondary | ICD-10-CM | POA: Diagnosis present

## 2021-07-14 DIAGNOSIS — R198 Other specified symptoms and signs involving the digestive system and abdomen: Secondary | ICD-10-CM

## 2021-07-14 MED ORDER — IOHEXOL 300 MG/ML  SOLN
100.0000 mL | Freq: Once | INTRAMUSCULAR | Status: AC | PRN
  Administered 2021-07-14: 100 mL via INTRAVENOUS

## 2021-08-15 DIAGNOSIS — M25552 Pain in left hip: Secondary | ICD-10-CM | POA: Diagnosis not present

## 2021-08-15 DIAGNOSIS — R2689 Other abnormalities of gait and mobility: Secondary | ICD-10-CM | POA: Diagnosis not present

## 2021-08-15 DIAGNOSIS — M5459 Other low back pain: Secondary | ICD-10-CM | POA: Diagnosis not present

## 2021-08-15 DIAGNOSIS — M25551 Pain in right hip: Secondary | ICD-10-CM | POA: Diagnosis not present

## 2021-08-22 DIAGNOSIS — M5459 Other low back pain: Secondary | ICD-10-CM | POA: Diagnosis not present

## 2021-08-22 DIAGNOSIS — M25551 Pain in right hip: Secondary | ICD-10-CM | POA: Diagnosis not present

## 2021-08-22 DIAGNOSIS — R2689 Other abnormalities of gait and mobility: Secondary | ICD-10-CM | POA: Diagnosis not present

## 2021-08-22 DIAGNOSIS — M25552 Pain in left hip: Secondary | ICD-10-CM | POA: Diagnosis not present

## 2021-08-29 DIAGNOSIS — K449 Diaphragmatic hernia without obstruction or gangrene: Secondary | ICD-10-CM | POA: Diagnosis not present

## 2021-08-29 DIAGNOSIS — K582 Mixed irritable bowel syndrome: Secondary | ICD-10-CM | POA: Diagnosis not present

## 2021-08-29 DIAGNOSIS — K625 Hemorrhage of anus and rectum: Secondary | ICD-10-CM | POA: Diagnosis not present

## 2021-08-29 DIAGNOSIS — R1084 Generalized abdominal pain: Secondary | ICD-10-CM | POA: Diagnosis not present

## 2021-08-29 DIAGNOSIS — K219 Gastro-esophageal reflux disease without esophagitis: Secondary | ICD-10-CM | POA: Diagnosis not present

## 2021-08-29 DIAGNOSIS — Z8379 Family history of other diseases of the digestive system: Secondary | ICD-10-CM | POA: Diagnosis not present

## 2021-08-29 DIAGNOSIS — R198 Other specified symptoms and signs involving the digestive system and abdomen: Secondary | ICD-10-CM | POA: Diagnosis not present

## 2021-08-31 DIAGNOSIS — M5459 Other low back pain: Secondary | ICD-10-CM | POA: Diagnosis not present

## 2021-08-31 DIAGNOSIS — M25552 Pain in left hip: Secondary | ICD-10-CM | POA: Diagnosis not present

## 2021-08-31 DIAGNOSIS — M25551 Pain in right hip: Secondary | ICD-10-CM | POA: Diagnosis not present

## 2021-08-31 DIAGNOSIS — R2689 Other abnormalities of gait and mobility: Secondary | ICD-10-CM | POA: Diagnosis not present

## 2021-09-07 DIAGNOSIS — J029 Acute pharyngitis, unspecified: Secondary | ICD-10-CM | POA: Diagnosis not present

## 2021-09-08 DIAGNOSIS — N920 Excessive and frequent menstruation with regular cycle: Secondary | ICD-10-CM | POA: Diagnosis not present

## 2021-09-23 DIAGNOSIS — J020 Streptococcal pharyngitis: Secondary | ICD-10-CM | POA: Diagnosis not present

## 2021-09-23 DIAGNOSIS — R002 Palpitations: Secondary | ICD-10-CM | POA: Diagnosis not present

## 2021-09-23 DIAGNOSIS — J029 Acute pharyngitis, unspecified: Secondary | ICD-10-CM | POA: Diagnosis not present

## 2021-10-03 DIAGNOSIS — R002 Palpitations: Secondary | ICD-10-CM | POA: Diagnosis not present

## 2021-10-13 DIAGNOSIS — R002 Palpitations: Secondary | ICD-10-CM | POA: Diagnosis not present

## 2021-10-15 ENCOUNTER — Other Ambulatory Visit: Payer: Self-pay

## 2021-10-15 ENCOUNTER — Ambulatory Visit
Admission: EM | Admit: 2021-10-15 | Discharge: 2021-10-15 | Disposition: A | Discharge status: Home / Self Care | Payer: BC Managed Care – PPO | Attending: Student | Admitting: Student

## 2021-10-15 DIAGNOSIS — J0391 Acute recurrent tonsillitis, unspecified: Secondary | ICD-10-CM | POA: Diagnosis not present

## 2021-10-15 DIAGNOSIS — H66001 Acute suppurative otitis media without spontaneous rupture of ear drum, right ear: Secondary | ICD-10-CM | POA: Diagnosis not present

## 2021-10-15 MED ORDER — BENZONATATE 100 MG PO CAPS: 100.0000 mg | ORAL_CAPSULE | Freq: Three times a day (TID) | ORAL | 0 refills | Status: DC

## 2021-10-15 MED ORDER — PROMETHAZINE-DM 6.25-15 MG/5ML PO SYRP: 5.0000 mL | ORAL_SOLUTION | Freq: Four times a day (QID) | ORAL | 0 refills | Status: DC | PRN

## 2021-10-15 MED ORDER — FLUCONAZOLE 150 MG PO TABS: 150.0000 mg | ORAL_TABLET | Freq: Every day | ORAL | 0 refills | Status: DC

## 2021-10-15 MED ORDER — AMOXICILLIN 875 MG PO TABS: 875.0000 mg | ORAL_TABLET | Freq: Two times a day (BID) | ORAL | 0 refills | Status: AC

## 2021-10-15 NOTE — ED Triage Notes (Addendum)
Pt present coughing with sore throat, nasal congestion and drainage. Symptom started on Wednesday.  Pt took an at  home covid test Thursday and the results were negative. Pt tried otc medication with no relief.  Pt states having difficulty swallowing.  ?

## 2021-10-15 NOTE — ED Provider Notes (Signed)
[facility]    CSN: [id number] Arrival date & time: 10/15/21  0841      History   Chief Complaint Chief Complaint  Patient presents with   Nasal Congestion   Cough   Sore Throat    HPI Ruth Haley is a 31 y.o. female presenting with nasal congestion, cough, sore throat.  History of recurrent strep throat.  Symptoms for about 4 days.  Has attempted over-the-counter medication without relief.  States significant pain with swallowing.  Cough is nonproductive, denies shortness of breath, chest pain, wheezing.  No history of pulmonary disease.  Right ear pain with muffled hearing, denies dizziness, tinnitus.  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      Home Medications    Prior to Admission medications   Medication Sig Start Date End Date Taking? Authorizing Provider  amoxicillin (AMOXIL) 875 MG tablet Take 1 tablet (875 mg total) by mouth 2 (two) times daily for 7 days. 10/15/21 10/22/21 Yes Rhys MartiniGraham, Lorilei Horan E, PA-C  benzonatate (TESSALON) 100 MG capsule Take 1 capsule (100 mg total) by mouth every 8 (eight) hours. 10/15/21  Yes Rhys MartiniGraham, Starasia Sinko E, PA-C  fluconazole (DIFLUCAN) 150 MG tablet Take 1 tablet (150 mg total) by mouth daily. -For your yeast infection, start the Diflucan (fluconazole)- Take one pill today (day 1). If you're still having symptoms in 3 days, take the second pill. 10/15/21  Yes Rhys MartiniGraham, Blue Winther E, PA-C  promethazine-dextromethorphan (PROMETHAZINE-DM) 6.25-15 MG/5ML syrup Take 5 mLs by mouth 4 (four) times daily as needed for cough. 10/15/21  Yes Rhys MartiniGraham, Mikaiya Tramble E, PA-C  PARAGARD INTRAUTERINE COPPER IUD IUD 1 Product by Intrauterine route See  admin instructions.    [provider]    Family History Family History  Problem Relation Age of Onset   Crohn's disease Father    Colitis Father    Congenital heart disease Father    Leukemia Father    Arthritis/Rheumatoid Father    Lung disease Father     Social History Social History   Tobacco Use   Smoking status: Former   Smokeless tobacco: Never  Substance Use Topics   Alcohol use: No   Drug use: No     Allergies   Mangifera indica, Morphine and related, and Adhesive [tape]   Review of Systems Review of Systems  Constitutional:  Negative for appetite change, chills and fever.  HENT:  Positive for congestion and sore throat. Negative for ear pain, rhinorrhea, sinus pressure and sinus pain.   Eyes:  Negative for redness and visual disturbance.  Respiratory:  Positive for cough. Negative for chest tightness, shortness of breath and wheezing.   Cardiovascular:  Negative for chest pain and palpitations.  Gastrointestinal:  Negative for abdominal pain, constipation, diarrhea, nausea and vomiting.  Genitourinary:  Negative for dysuria, frequency and urgency.  Musculoskeletal:  Negative for myalgias.  Neurological:  Negative for dizziness, weakness and headaches.  Psychiatric/Behavioral:  Negative for confusion.   All other systems reviewed and are negative.   Physical Exam Triage Vital Signs ED Triage Vitals  Enc Vitals Group     BP --  Pulse Rate 10/15/21 0922 85     Resp 10/15/21 0922 16     Temp 10/15/21 0922 98.2 F (36.8 C)     Temp Source 10/15/21 0922 Oral     SpO2 10/15/21 0922 100 %     Weight --      Height --      Head Circumference --      Peak Flow --      Pain Score 10/15/21 0923 6     Pain Loc --      Pain Edu? --      Excl. in GC? --    No data found.  Updated Vital Signs Pulse 85    Temp 98.2 F (36.8 C) (Oral)    Resp 16    SpO2 100%   Visual Acuity Right Eye Distance:   Left Eye Distance:   Bilateral Distance:     Right Eye Near:   Left Eye Near:    Bilateral Near:     Physical Exam Vitals reviewed.  Constitutional:      General: She is not in acute distress.    Appearance: Normal appearance. She is not ill-appearing.  HENT:     Head: Normocephalic and atraumatic.     Right Ear: Ear canal and external ear normal. No tenderness. No middle ear effusion. There is no impacted cerumen. Tympanic membrane is erythematous and bulging. Tympanic membrane is not perforated or retracted.     Left Ear: Tympanic membrane, ear canal and external ear normal. No tenderness.  No middle ear effusion. There is no impacted cerumen. Tympanic membrane is not perforated, erythematous, retracted or bulging.     Nose: Congestion present.     Mouth/Throat:     Mouth: Mucous membranes are moist.     Pharynx: Uvula midline. Posterior oropharyngeal erythema present. No oropharyngeal exudate.     Tonsils: No tonsillar exudate. 1+ on the right. 1+ on the left.     Comments: Tonsils 1+ bilaterally with erythema but no exudate. On exam, uvula is midline, she is tolerating her secretions without difficulty, there is no trismus, no drooling, she has normal phonation  Eyes:     Extraocular Movements: Extraocular movements intact.     Pupils: Pupils are equal, round, and reactive to light.  Cardiovascular:     Rate and Rhythm: Normal rate and regular rhythm.     Heart sounds: Normal heart sounds.  Pulmonary:     Effort: Pulmonary effort is normal.     Breath sounds: Normal breath sounds. No decreased breath sounds, wheezing, rhonchi or rales.  Abdominal:     Palpations: Abdomen is soft.     Tenderness: There is no abdominal tenderness. There is no guarding or rebound.  Lymphadenopathy:     Cervical: No cervical adenopathy.     Right cervical: No superficial cervical adenopathy.    Left cervical: No superficial cervical adenopathy.  Neurological:     General: No focal deficit present.     Mental Status: She is alert and  oriented to person, place, and time.  Psychiatric:        Mood and Affect: Mood normal.        Behavior: Behavior normal.        Thought Content: Thought content normal.        Judgment: Judgment normal.     UC Treatments / Results  Labs (all labs ordered are listed, but only abnormal results are displayed) Labs Reviewed - No data to display  EKG   Radiology No results found.  Procedures Procedures (including critical care time)  Medications Ordered in UC Medications - No data to display  Initial Impression / Assessment and Plan / UC Course  I have reviewed the triage vital signs and the nursing notes.  Pertinent labs & imaging results that were available during my care of the patient were reviewed by me and considered in my medical decision making (see chart for details).     This patient is a very pleasant 31 y.o. year old female presenting with R AOM related to viral syndrome. Afebrile, nontachy. Nontoxic appearing, no adventitious breath sounds. States she is not pregnant or breastfeeding.  Negative home covid test.  History recurrent strep. Centor score 1. Did not check a strep PCR as the Amoxicillin will cover for this.   Amoxicillin, tessalon, promethazine DM sent. Diflucan for yeast prophylaxis.   ED return precautions discussed. Patient verbalizes understanding and agreement.    Final Clinical Impressions(s) / UC Diagnoses   Final diagnoses:  Non-recurrent acute suppurative otitis media of right ear without spontaneous rupture of tympanic membrane  Recurrent tonsillitis     Discharge Instructions      -Amoxicillin twice daily x7 days. Take with food if you have a sensitive stomach.  -Tessalon (Benzonatate) as needed for cough. Take one pill up to 3x daily (every 8 hours) -Promethazine DM cough syrup for congestion/cough. This could make you drowsy, so take at night before bed. -You can take Tylenol up to 1000 mg 3 times daily, and ibuprofen up to 600  mg 3 times daily with food.  You can take these together, or alternate every 3-4 hours. -Flonase nasal steroid: place 2 sprays into both nostrils in the morning and at bedtime for at least 7 days. Continue for longer if this is helping. This medication is over-the-counter.  -With a virus, you're typically contagious for 5-7 days, or as long as you're having fevers.     ED Prescriptions     Medication Sig Dispense Auth. Provider   amoxicillin (AMOXIL) 875 MG tablet Take 1 tablet (875 mg total) by mouth 2 (two) times daily for 7 days. 14 tablet Rhys Martini, PA-C   benzonatate (TESSALON) 100 MG capsule Take 1 capsule (100 mg total) by mouth every 8 (eight) hours. 21 capsule Rhys Martini, PA-C   promethazine-dextromethorphan (PROMETHAZINE-DM) 6.25-15 MG/5ML syrup Take 5 mLs by mouth 4 (four) times daily as needed for cough. 118 mL Rhys Martini, PA-C   fluconazole (DIFLUCAN) 150 MG tablet Take 1 tablet (150 mg total) by mouth daily. -For your yeast infection, start the Diflucan (fluconazole)- Take one pill today (day 1). If you're still having symptoms in 3 days, take the second pill. 2 tablet Rhys Martini, PA-C      PDMP not reviewed this encounter.   Rhys Martini, PA-C 10/15/21 [phone]

## 2021-10-15 NOTE — Discharge Instructions (Addendum)
-  Amoxicillin twice daily x7 days. Take with food if you have a sensitive stomach.  ?-Tessalon (Benzonatate) as needed for cough. Take one pill up to 3x daily (every 8 hours) ?-Promethazine DM cough syrup for congestion/cough. This could make you drowsy, so take at night before bed. ?-You can take Tylenol up to 1000 mg 3 times daily, and ibuprofen up to 600 mg 3 times daily with food.  You can take these together, or alternate every 3-4 hours. ?-Flonase nasal steroid: place 2 sprays into both nostrils in the morning and at bedtime for at least 7 days. Continue for longer if this is helping. This medication is over-the-counter.  ?-With a virus, you're typically contagious for 5-7 days, or as long as you're having fevers.  ? ?

## 2021-10-20 DIAGNOSIS — Z30433 Encounter for removal and reinsertion of intrauterine contraceptive device: Secondary | ICD-10-CM | POA: Diagnosis not present

## 2021-10-21 DIAGNOSIS — R002 Palpitations: Secondary | ICD-10-CM | POA: Diagnosis not present

## 2021-10-21 DIAGNOSIS — J029 Acute pharyngitis, unspecified: Secondary | ICD-10-CM | POA: Diagnosis not present

## 2021-10-21 DIAGNOSIS — G43009 Migraine without aura, not intractable, without status migrainosus: Secondary | ICD-10-CM | POA: Diagnosis not present

## 2021-10-21 DIAGNOSIS — H6693 Otitis media, unspecified, bilateral: Secondary | ICD-10-CM | POA: Diagnosis not present

## 2021-10-24 DIAGNOSIS — E669 Obesity, unspecified: Secondary | ICD-10-CM | POA: Diagnosis not present

## 2021-10-24 DIAGNOSIS — R9431 Abnormal electrocardiogram [ECG] [EKG]: Secondary | ICD-10-CM | POA: Diagnosis not present

## 2021-10-24 DIAGNOSIS — I471 Supraventricular tachycardia: Secondary | ICD-10-CM | POA: Diagnosis not present

## 2021-10-24 DIAGNOSIS — R002 Palpitations: Secondary | ICD-10-CM | POA: Diagnosis not present

## 2021-11-02 DIAGNOSIS — R002 Palpitations: Secondary | ICD-10-CM | POA: Diagnosis not present

## 2021-11-03 DIAGNOSIS — J3501 Chronic tonsillitis: Secondary | ICD-10-CM | POA: Diagnosis not present

## 2021-11-03 DIAGNOSIS — H6983 Other specified disorders of Eustachian tube, bilateral: Secondary | ICD-10-CM | POA: Diagnosis not present

## 2021-11-03 DIAGNOSIS — H93293 Other abnormal auditory perceptions, bilateral: Secondary | ICD-10-CM | POA: Diagnosis not present

## 2021-11-03 DIAGNOSIS — R002 Palpitations: Secondary | ICD-10-CM | POA: Diagnosis not present

## 2021-11-03 DIAGNOSIS — J301 Allergic rhinitis due to pollen: Secondary | ICD-10-CM | POA: Diagnosis not present

## 2021-11-18 ENCOUNTER — Encounter: Payer: BC Managed Care – PPO | Admitting: Cardiology

## 2021-11-18 ENCOUNTER — Encounter: Payer: Self-pay | Admitting: Cardiology

## 2021-11-28 ENCOUNTER — Ambulatory Visit: Payer: BC Managed Care – PPO | Admitting: Cardiology

## 2021-11-28 ENCOUNTER — Encounter: Payer: Self-pay | Admitting: Cardiology

## 2021-11-28 VITALS — BP 104/70 | HR 54 | Ht 62.0 in | Wt 203.0 lb

## 2021-11-28 DIAGNOSIS — Z6837 Body mass index (BMI) 37.0-37.9, adult: Secondary | ICD-10-CM

## 2021-11-28 DIAGNOSIS — I499 Cardiac arrhythmia, unspecified: Secondary | ICD-10-CM

## 2021-11-28 NOTE — Patient Instructions (Signed)
Medication Instructions:  ? ?Your physician recommends that you continue on your current medications as directed. Please refer to the Current Medication list given to you today. ? ?*If you need a refill on your cardiac medications before your next appointment, please call your pharmacy* ? ? ?Lab Work: ? ?None ordered ? ?If you have labs (blood work) drawn today and your tests are completely normal, you will receive your results only by: ?MyChart Message (if you have MyChart) OR ?A paper copy in the mail ?If you have any lab test that is abnormal or we need to change your treatment, we will call you to review the results. ? ? ?Testing/Procedures: ? ?None ordered ? ? ?Follow-Up: ?At [facility], you and your health needs are our priority.  As part of our continuing mission to provide you with exceptional heart care, we have created designated Provider Care Teams.  These Care Teams include your primary Cardiologist (physician) and Advanced Practice Providers (APPs -  Physician Assistants and Nurse Practitioners) who all work together to provide you with the care you need, when you need it. ? ?We recommend signing up for the patient portal called "MyChart".  Sign up information is provided on this After Visit Summary.  MyChart is used to connect with patients for Virtual Visits (Telemedicine).  Patients are able to view lab/test results, encounter notes, upcoming appointments, etc.  Non-urgent messages can be sent to your provider as well.   ?To learn more about what you can do with MyChart, go to [email or web address].   ? ?Your next appointment:   ? ?Follow up as needed  ? ?The format for your next appointment:   ?In Person ? ?Provider:   ?You may see Brian Agbor-Etang, MD or one of the following Advanced Practice Providers on your designated Care Team:   ?Christopher Berge, NP ?Ryan Dunn, PA-C ?Cadence Furth, PA-C  ? ? ?Other Instructions ? ? ?Important Information About Sugar ? ? ? ? ? ? ?

## 2021-11-28 NOTE — Progress Notes (Signed)
?Cardiology Office Note:   ? ?Date:  11/28/2021  ? ?ID:  Ruth Haley, DOB 05-31-1991, MRN [id number] ? ?PCP:  [facility] ?  ?[facility] Providers ?Cardiologist:  None    ? ?Referring MD: [facility]  ? ?Chief Complaint  ?Patient presents with  ? New Patient (Initial Visit)  ?  Self referral -- patient c/o palpations. Meds reviewed verbally with patient.   ? ? ?History of Present Illness:   ? ?Ruth Haley is a 31 y.o. female with a hx of seizures who presents due to palpitations.  Patient states having symptoms of irregular heartbeats ongoing over the past year.  Symptoms are sometimes associated with shortness of breath.  Denies dizziness, presyncope or syncope.  Denies chest pain.  Denies smoking. ? ?[facility], echocardiogram, Lexiscan Myoview and cardiac monitor x2 weeks was performed. ? ?Echo showed normal EF, Myoview with no ischemia. ? ?Cardiac monitor reviewed by myself showed 1 episode of VT lasting 7 beats.  No other significant arrhythmias noted.  ? ?Past Medical History:  ?Diagnosis Date  ? Headache   ? Seizures ([place])   ? ? ?Past Surgical History:  ?Procedure Laterality Date  ? CESAREAN SECTION    ? ? ?Current Medications: ?No outpatient medications have been marked as taking for the 11/28/21 encounter (Office Visit) with Kate Sable, MD.  ?  ? ?Allergies:   Mangifera indica, Morphine and related, and Adhesive [tape]  ? ?Social History  ? ?Socioeconomic History  ? Marital status: Married  ?  Spouse name: Not on file  ? Number of children: Not on file  ? Years of education: Not on file  ? Highest education level: Not on file  ?Occupational History  ? Not on file  ?Tobacco Use  ? Smoking status: Former  ? Smokeless tobacco: Never  ?Substance and Sexual Activity  ? Alcohol use: No  ? Drug use: No  ? Sexual activity: Yes  ?  Birth control/protection: I.U.D.  ?Other Topics Concern  ? Not on file  ?Social History Narrative  ? Not on file   ? ?Social Determinants of Health  ? ?Financial Resource Strain: Not on file  ?Food Insecurity: Not on file  ?Transportation Needs: Not on file  ?Physical Activity: Not on file  ?Stress: Not on file  ?Social Connections: Not on file  ?  ? ?Family History: ?The patient's family history includes Arthritis/Rheumatoid in her father; Colitis in her father; Congenital heart disease in her father; Crohn's disease in her father; Leukemia in her father; Lung disease in her father. ? ?ROS:   ?Please see the history of present illness.    ? All other systems reviewed and are negative. ? ?EKGs/Labs/Other Studies Reviewed:   ? ?The following studies were reviewed today: ? ? ?EKG:  EKG not ordered today.   ? ?Recent Labs: ?No results found for requested labs within last 8760 hours.  ?Recent Lipid Panel ?No results found for: CHOL, TRIG, HDL, CHOLHDL, VLDL, LDLCALC, LDLDIRECT ? ? ?Risk Assessment/Calculations:   ? ? ?    ? ?Physical Exam:   ? ?VS:  BP 104/70 (BP Location: Left Arm, Patient Position: Sitting, Cuff Size: Large)   Pulse (!) 54   Ht 5\' 2"  (1.575 m)   Wt 203 lb (92.1 kg)   SpO2 99%   BMI 37.13 kg/m?    ? ?Wt Readings from Last 3 Encounters:  ?11/28/21 203 lb (92.1 kg)  ?08/09/20 189 lb 12.8 oz (86.1 kg)  ?  05/31/20 184 lb 6.4 oz (83.6 kg)  ?  ? ?GEN:  Well nourished, well developed in no acute distress ?HEENT: Normal ?NECK: No JVD; No carotid bruits ?LYMPHATICS: No lymphadenopathy ?CARDIAC: RRR, no murmurs, rubs, gallops ?RESPIRATORY:  Clear to auscultation without rales, wheezing or rhonchi  ?ABDOMEN: Soft, non-tender, non-distended ?MUSCULOSKELETAL:  No edema; No deformity  ?SKIN: Warm and dry ?NEUROLOGIC:  Alert and oriented x 3 ?PSYCHIATRIC:  Normal affect  ? ?ASSESSMENT:   ? ?1. Irregular heart beat   ?2. BMI 37.0-37.9, adult   ? ?PLAN:   ? ?In order of problems listed above: ? ?Irregular heartbeat, work-up with outside echocardiogram and Lexiscan Myoview were normal.  Cardiac monitor reviewed by myself  showing 1 episode of VT lasting 4 beats.  Patient's heart rate low into the 50s.  Since symptoms are not significant, we will not start AV nodal agents such as beta-blocker in light of patient's bradycardia.  Patient agrees, stating she would not want to start the medicine at this time which I think is reasonable. ?Obesity, low-calorie diet, weight loss advised. ? ?Follow-up as needed ? ?   ?Medication Adjustments/Labs and Tests Ordered: ?Current medicines are reviewed at length with the patient today.  Concerns regarding medicines are outlined above.  ?No orders of the defined types were placed in this encounter. ? ?No orders of the defined types were placed in this encounter. ? ? ?Patient Instructions  ?Medication Instructions:  ?Your physician recommends that you continue on your current medications as directed. Please refer to the Current Medication list given to you today. ? ?*If you need a refill on your cardiac medications before your next appointment, please call your pharmacy* ? ? ?Lab Work: ?None ordered ?If you have labs (blood work) drawn today and your tests are completely normal, you will receive your results only by: ?MyChart Message (if you have MyChart) OR ?A paper copy in the mail ?If you have any lab test that is abnormal or we need to change your treatment, we will call you to review the results. ? ? ?Testing/Procedures: ?None ordered ? ? ?Follow-Up: ?At [facility], you and your health needs are our priority.  As part of our continuing mission to provide you with exceptional heart care, we have created designated Provider Care Teams.  These Care Teams include your primary Cardiologist (physician) and Advanced Practice Providers (APPs -  Physician Assistants and Nurse Practitioners) who all work together to provide you with the care you need, when you need it. ? ?We recommend signing up for the patient portal called "MyChart".  Sign up information is provided on this After Visit Summary.   MyChart is used to connect with patients for Virtual Visits (Telemedicine).  Patients are able to view lab/test results, encounter notes, upcoming appointments, etc.  Non-urgent messages can be sent to your provider as well.   ?To learn more about what you can do with MyChart, go to [email or web address].   ? ?Your next appointment:   ?Follow up as needed  ? ?The format for your next appointment:   ?In Person ? ?Provider:   ?You may see Debbe Odea, MD or one of the following Advanced Practice Providers on your designated Care Team:   ?Nicolasa Ducking, NP ?Eula Listen, PA-C ?Cadence Fransico Michael, PA-C  ? ? ?Other Instructions ? ? ?Important Information About Sugar ? ? ? ? ? ?  ? ?Signed, ?Debbe Odea, MD  ?11/28/2021 3:21 PM    ?[facility] ?

## 2021-12-13 DIAGNOSIS — N905 Atrophy of vulva: Secondary | ICD-10-CM | POA: Diagnosis not present

## 2021-12-13 DIAGNOSIS — N926 Irregular menstruation, unspecified: Secondary | ICD-10-CM | POA: Diagnosis not present

## 2021-12-24 DIAGNOSIS — N762 Acute vulvitis: Secondary | ICD-10-CM | POA: Diagnosis not present

## 2021-12-26 DIAGNOSIS — N898 Other specified noninflammatory disorders of vagina: Secondary | ICD-10-CM | POA: Diagnosis not present

## 2021-12-26 DIAGNOSIS — R102 Pelvic and perineal pain: Secondary | ICD-10-CM | POA: Diagnosis not present

## 2021-12-26 DIAGNOSIS — Z131 Encounter for screening for diabetes mellitus: Secondary | ICD-10-CM | POA: Diagnosis not present

## 2022-01-04 DIAGNOSIS — N898 Other specified noninflammatory disorders of vagina: Secondary | ICD-10-CM | POA: Diagnosis not present

## 2022-03-09 DIAGNOSIS — F411 Generalized anxiety disorder: Secondary | ICD-10-CM | POA: Diagnosis not present

## 2022-03-09 DIAGNOSIS — G40509 Epileptic seizures related to external causes, not intractable, without status epilepticus: Secondary | ICD-10-CM | POA: Diagnosis not present

## 2022-03-09 DIAGNOSIS — G479 Sleep disorder, unspecified: Secondary | ICD-10-CM | POA: Diagnosis not present

## 2022-03-09 DIAGNOSIS — G43009 Migraine without aura, not intractable, without status migrainosus: Secondary | ICD-10-CM | POA: Diagnosis not present

## 2022-03-14 DIAGNOSIS — J029 Acute pharyngitis, unspecified: Secondary | ICD-10-CM | POA: Diagnosis not present

## 2022-03-14 DIAGNOSIS — Z20822 Contact with and (suspected) exposure to covid-19: Secondary | ICD-10-CM | POA: Diagnosis not present

## 2022-04-13 DIAGNOSIS — J3501 Chronic tonsillitis: Secondary | ICD-10-CM | POA: Diagnosis not present

## 2022-04-13 DIAGNOSIS — J310 Chronic rhinitis: Secondary | ICD-10-CM | POA: Diagnosis not present

## 2022-04-13 DIAGNOSIS — J301 Allergic rhinitis due to pollen: Secondary | ICD-10-CM | POA: Diagnosis not present

## 2022-04-17 IMAGING — CT CT ABD-PELV W/ CM
2 of 4 series · 17 of 46 positions shown, 19 images · IV contrast (APPLIED)
Comparison: None.

CLINICAL DATA: Worsening abdominal pain history of IBS.

EXAM:
CT ABDOMEN AND PELVIS WITH CONTRAST
TECHNIQUE: Multidetector CT imaging of the abdomen and pelvis was performed
using the standard protocol following bolus administration of
intravenous contrast.
CONTRAST:  100mL OMNIPAQUE IOHEXOL 300 MG/ML  SOLN

[Series 2: routine abd/pel with · axial · 0.94mm/px · z∈[-650,-234]mm · 14 of 91 slices shown, 16 images]
[im 4/91  soft-tissue]
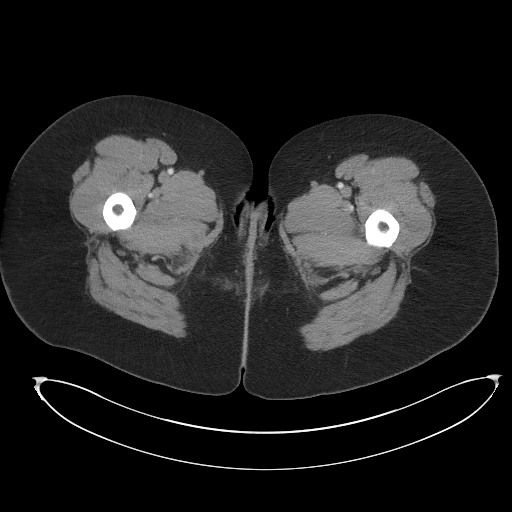
[im 4/91  bone]
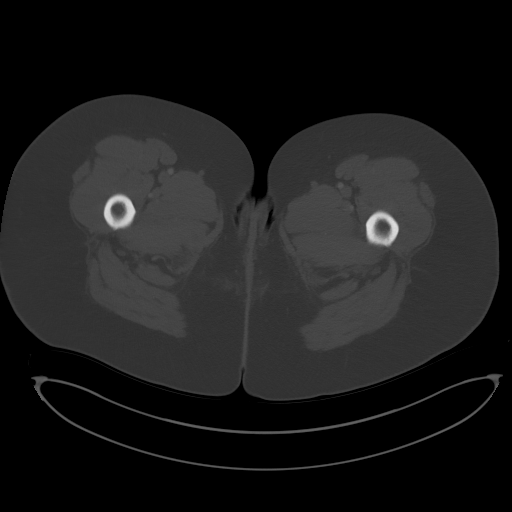
[im 12/91  soft-tissue]
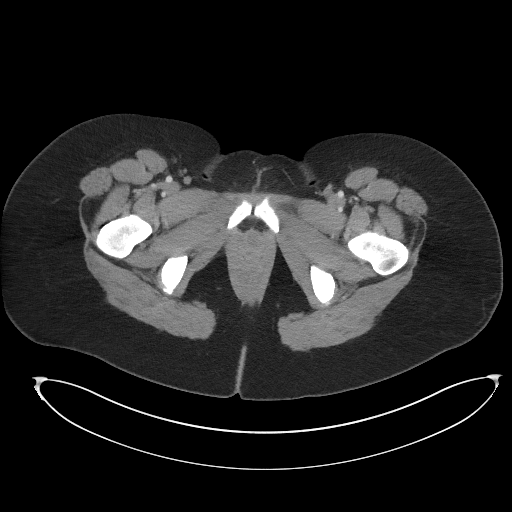
[im 16/91  soft-tissue]
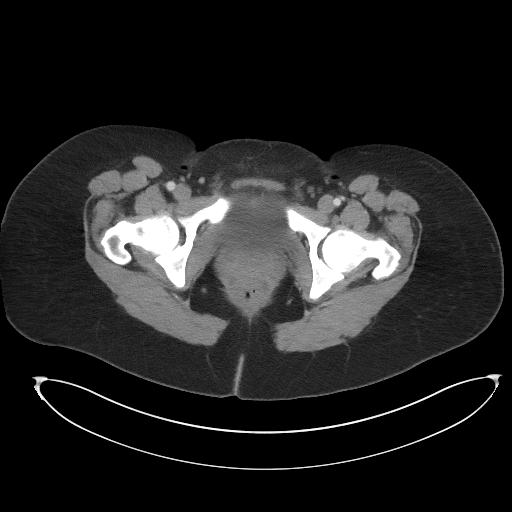
[im 24/91  soft-tissue]
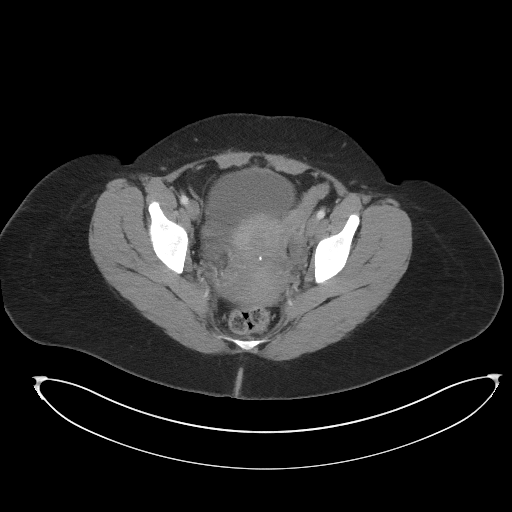
[im 32/91  soft-tissue]
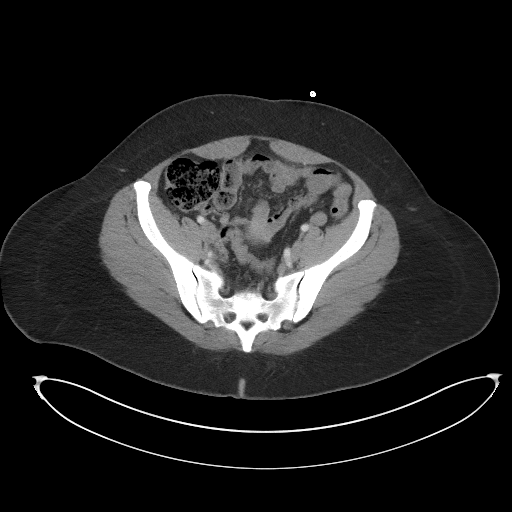
[im 36/91  soft-tissue]
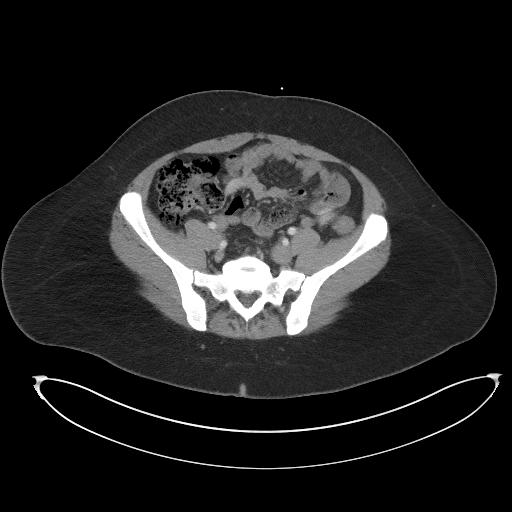
[im 44/91  soft-tissue]
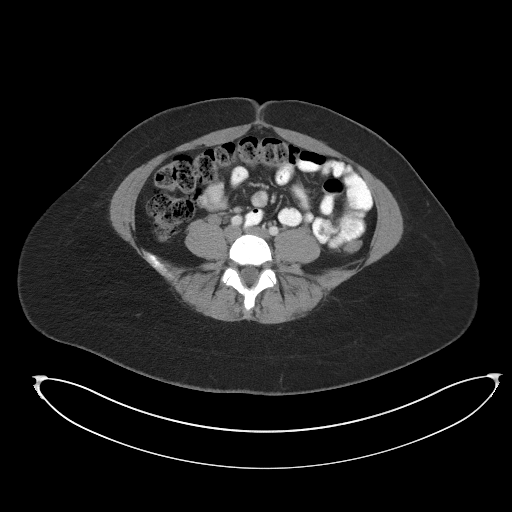
[im 47/91  soft-tissue]
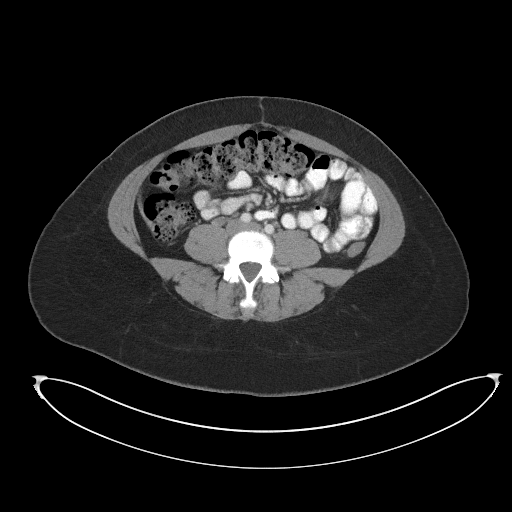
[im 55/91  soft-tissue]
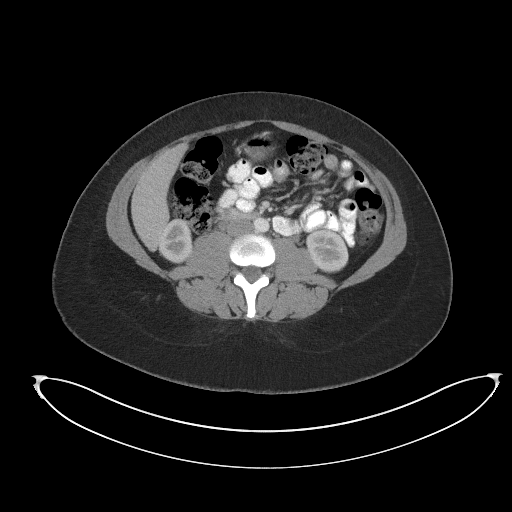
[im 55/91  bone]
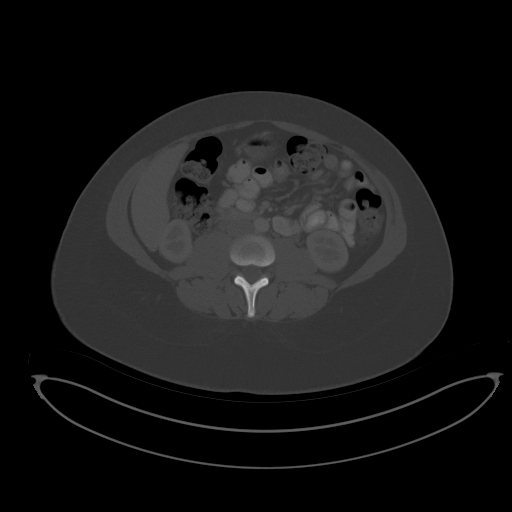
[im 59/91  soft-tissue]
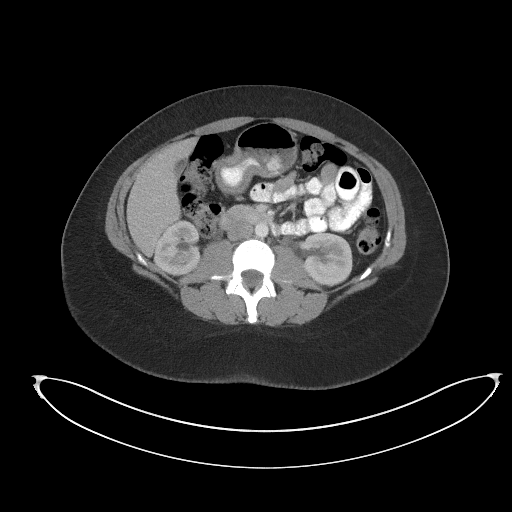
[im 67/91  soft-tissue]
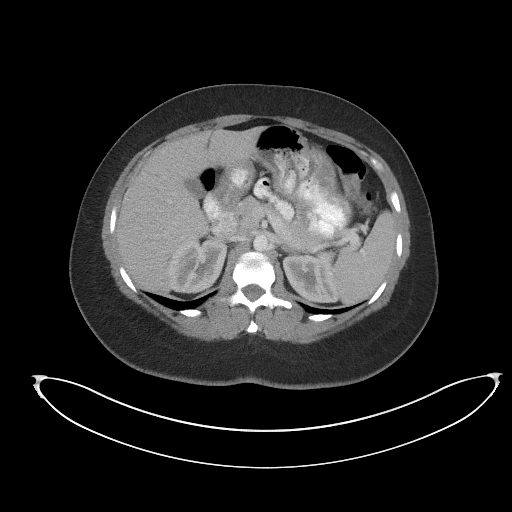
[im 75/91  soft-tissue]
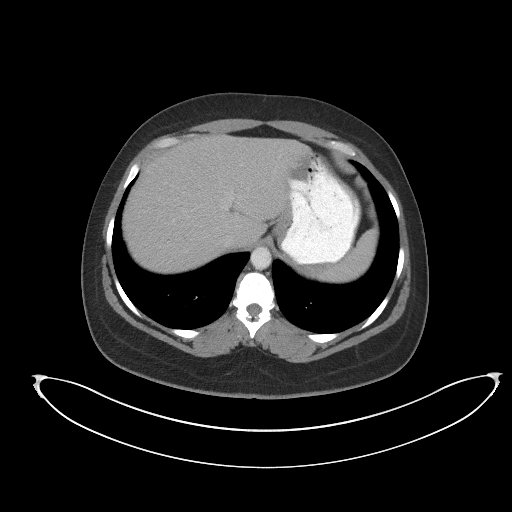
[im 79/91  soft-tissue]
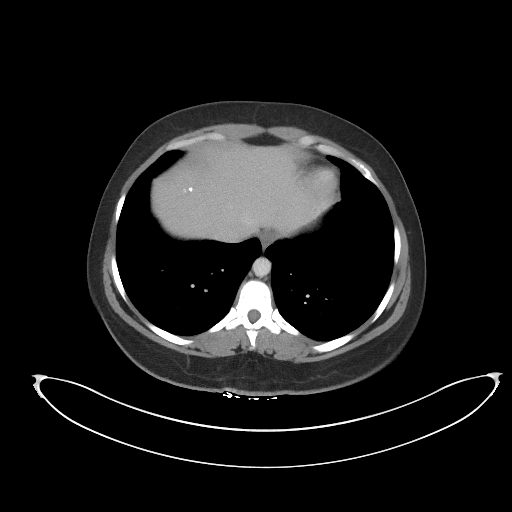
[im 87/91  soft-tissue]
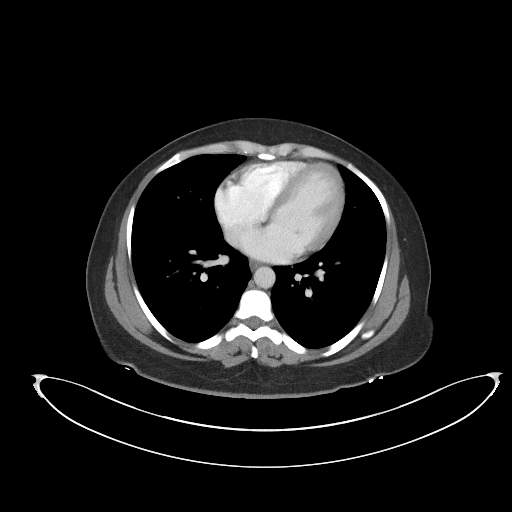

[Series 5: coronal st · coronal · 0.93mm/px · 3 of 101 slices shown]
[im 34/101  soft-tissue]
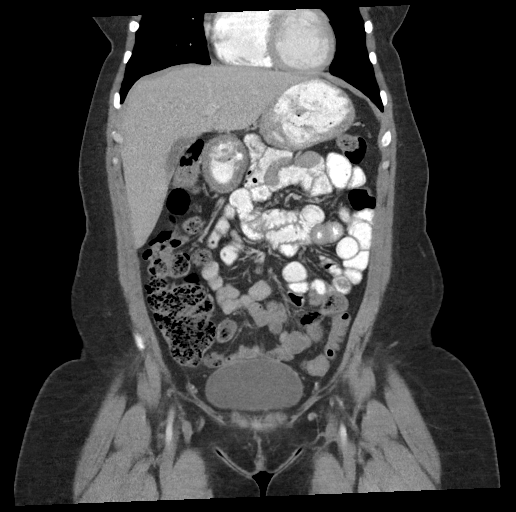
[im 45/101  soft-tissue]
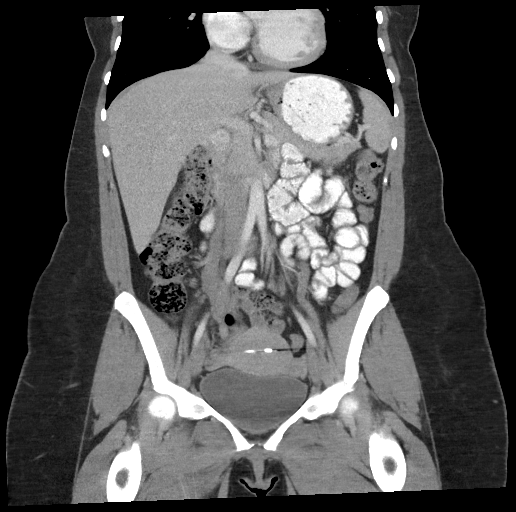
[im 56/101  soft-tissue]
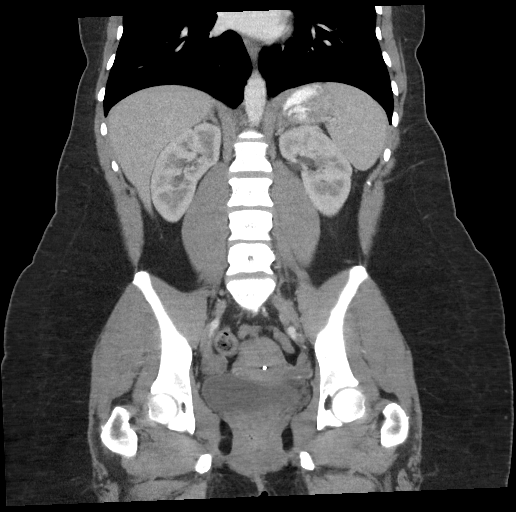

[17 of 46 positions shown; findings below may reference images not displayed]

FINDINGS: Lower chest: No acute abnormality.

Hepatobiliary: Scattered bilobar subcentimeter hypodense hepatic
lesions are technically too small to accurately characterize but
statistically likely to reflect hepatic cysts. Gallbladder is
unremarkable. No biliary ductal dilation.

Pancreas: No pancreatic ductal dilation or evidence of acute
inflammation.

Spleen: Within normal limits.

Adrenals/Urinary Tract: Adrenal glands are unremarkable. Kidneys are
normal, without renal calculi, solid enhancing lesion, or
hydronephrosis. Bladder is unremarkable.

Stomach/Bowel: Radiopaque enteric contrast material traverses distal
loops of small bowel. Stomach is unremarkable for degree of
distension. No pathologic dilation of small or large bowel. The
appendix and terminal ileum appear normal. Moderate volume of stool
in the proximal colon. The descending and sigmoid colon are
predominantly decompressed limiting evaluation.

Vascular/Lymphatic: No significant vascular finding. No
pathologically enlarged abdominal or pelvic lymph nodes.

Reproductive: Intrauterine device appears appropriate in
positioning. No suspicious adnexal mass.

Other: No significant abdominopelvic free fluid. No
pneumoperitoneum. No abdominal wall hernia.

Musculoskeletal: No acute or significant osseous findings.
IMPRESSION: No acute abdominopelvic findings.

Moderate volume of formed stool in the proximal colon may reflect
constipation.

## 2022-04-19 DIAGNOSIS — G40509 Epileptic seizures related to external causes, not intractable, without status epilepticus: Secondary | ICD-10-CM | POA: Diagnosis not present

## 2022-04-19 DIAGNOSIS — G43009 Migraine without aura, not intractable, without status migrainosus: Secondary | ICD-10-CM | POA: Diagnosis not present

## 2022-04-19 DIAGNOSIS — R4789 Other speech disturbances: Secondary | ICD-10-CM | POA: Diagnosis not present

## 2022-04-19 DIAGNOSIS — G479 Sleep disorder, unspecified: Secondary | ICD-10-CM | POA: Diagnosis not present

## 2022-04-24 DIAGNOSIS — F329 Major depressive disorder, single episode, unspecified: Secondary | ICD-10-CM | POA: Diagnosis not present

## 2022-04-26 ENCOUNTER — Encounter: Payer: Self-pay | Admitting: Otolaryngology

## 2022-04-28 NOTE — Discharge Instructions (Signed)
T & A INSTRUCTION SHEET - [facility]  P. SCOTT BENNETT, MD  INFORMATION SHEET FOR A TONSILLECTOMY AND ADENDOIDECTOMY  About Your Tonsils and Adenoids The tonsils and adenoids are normal body tissues that are part of our immune system. They normally help to protect us against diseases that may enter our mouth and nose. However, sometimes the tonsils and/or adenoids become too large and obstruct our breathing, especially at night.  If either of these things happen it helps to remove the tonsils and adenoids in order to become healthier. The operation to remove the tonsils and adenoids is called a tonsillectomy and adenoidectomy.  The Location of Your Tonsils and Adenoids The tonsils are located in the back of the throat on both side and sit in a cradle of muscles. The adenoids are located in the roof of the mouth, behind the nose, and closely associated with the opening of the Eustachian tube to the ear.  Surgery on Tonsils and Adenoids A tonsillectomy and adenoidectomy is a short operation which takes about thirty minutes. This includes being put to sleep and being awakened. Tonsillectomies and adenoidectomies are performed at [facility] and may require observation period in the recovery room prior to going home. Children are required to remain in the recovery area for 45 minutes after surgery.  Following the Operation for a Tonsillectomy A cautery machine is used to control bleeding.  Bleeding from a tonsillectomy and adenoidectomy is minimal and postoperatively the risk of bleeding is approximately four percent, although this rarely life threatening.  After your tonsillectomy and adenoidectomy post-op care at home: 1. Our patients are able to go home the same day. You may be given prescriptions for pain medications and antibiotics, if indicated. 2. It is extremely important to remember that fluid intake is of utmost importance after a  tonsillectomy. The amount that you drink must be maintained in the postoperative period. A good indication of whether a child is getting enough fluid is whether his/her urine output is constant.  As long as children are urinating or wetting their diaper every 6 - 8 hours this is usually enough fluid intake.   3. Although rare, this is a risk of some bleeding in the first ten days after surgery. This usually occurs between day five and nine postoperatively. This risk of bleeding is approximately four percent.  If you or your child should have any bleeding you should remain calm and notify our office or go directly to the Emergency Room at [facility] where they will contact us. Our doctors are available seven days a week for notification. We recommend sitting up quietly in a chair, place an ice pack on the front of the neck and spitting out the blood gently until we are able to contact you. Adults should gargle gently with ice water and this may help stop the bleeding. If the bleeding does not stop after a short time, i.e. 10 to 15 minutes, or seems to be increasing again, please contact us or go to the hospital.   4. It is common for the pain to be worse at 5 - 7 days postoperatively. This occurs because the "scab" is peeling off and the mucous membrane (skin of the throat) is growing back where the tonsils were.   5. It is common for a low-grade fever, less than 102, during the first week after a tonsillectomy and adenoidectomy. It is usually due to not drinking enough   liquids, and we suggest your use liquid Tylenol (acetaminophen) or the pain medicine with Tylenol (acetaminophen) prescribed in order to keep your temperature below 102. Please follow the directions on the back of the bottle. 6. Do not take aspirin or any products that contain aspirin such as Bufferin, Anacin, Ecotrin, aspirin gum, Goodies, BC headache powders, etc., after a T&A because it can promote bleeding.  DO NOT TAKE  MOTRIN OR IBUPROFEN. Please check with our office before administering any other medication that may been prescribed by other doctors during the two-week post-operative period. 7. If you happen to look in the mirror or into your child's mouth you will see white/gray patches on the back of the throat.  This is what a scab looks like in the mouth and is normal after having a tonsillectomy and adenoidectomy. It will disappear once the tonsil area heals completely. However, it may cause a noticeable odor, and this too will disappear with time.     8. You or your child may experience ear pain after having a tonsillectomy and adenoidectomy. This is called referred pain and comes from the throat, but it is felt in the ears. Ear pain is quite common and expected. It will usually go away after ten days. There is usually nothing wrong with the ears, and it is primarily due to the healing area stimulating the nerve to the ear that runs along the side of the throat. Use either the prescribed pain medicine or Tylenol (acetaminophen) as needed.  9. The throat tissues after a tonsillectomy are obviously sensitive. Smoking around children who have had a tonsillectomy significantly increases the risk of bleeding.  DO NOT SMOKE! What to Expect Each Day  First Day at Home 1. Patients will be discharged home the same day.  2. Drink at least four glasses of liquid a day. Clear, cool liquids are recommended. Fruit juices containing citric acid are not recommended because they tend to cause pain. Carbonated beverages are allowed if you pour them from glass to glass to remove the bubbles as these tend to cause discomfort. Avoid alcoholic beverages.  3. Eat very soft foods such as soups, broth, jello, custard, pudding, ice cream, popsicles, applesauce, mashed potatoes, and in general anything that you can crush between your tongue and the roof of your mouth. Try adding Carnation Instant Breakfast Mix into your food for extra  calories. It is not uncommon to lose 5 to 10 pounds of fluid weight. The weight will be gained back quickly once you're feeling better and drinking more.  4. Sleep with your head elevated on two pillows for about three days to help decrease the swelling.  5. DO NOT SMOKE!  Day Two  1. Rest as much as possible. Use common sense in your activities.  2. Continue drinking at least four glasses of liquid per day.  3. Follow the soft diet.  4. Use your pain medication as needed.  Day Three  1. Advance your activity as you are able and continue to follow the previous day's suggestions.  Days Four Through Six  1. Advance your diet and begin to eat more solid foods such as chopped hamburger. 2. Advance your activities slowly. Children should be kept mostly around the house.  3. Not uncommonly, there will be more pain at this time. It is temporary, usually lasting a day or two.  Day Seven Through Ten  1. Most individuals by this time are able to return to work or school unless otherwise instructed.   Consider sending children back to school for a half day on the first day back. 

## 2022-04-30 ENCOUNTER — Ambulatory Visit
Admission: EM | Admit: 2022-04-30 | Discharge: 2022-04-30 | Disposition: A | Discharge status: Home / Self Care | Payer: BC Managed Care – PPO

## 2022-04-30 DIAGNOSIS — Z8379 Family history of other diseases of the digestive system: Secondary | ICD-10-CM | POA: Diagnosis not present

## 2022-04-30 DIAGNOSIS — R339 Retention of urine, unspecified: Secondary | ICD-10-CM

## 2022-04-30 DIAGNOSIS — R109 Unspecified abdominal pain: Secondary | ICD-10-CM

## 2022-04-30 DIAGNOSIS — K581 Irritable bowel syndrome with constipation: Secondary | ICD-10-CM | POA: Diagnosis not present

## 2022-04-30 DIAGNOSIS — Z6838 Body mass index (BMI) 38.0-38.9, adult: Secondary | ICD-10-CM | POA: Diagnosis not present

## 2022-04-30 DIAGNOSIS — K6289 Other specified diseases of anus and rectum: Secondary | ICD-10-CM | POA: Diagnosis not present

## 2022-04-30 DIAGNOSIS — Z79899 Other long term (current) drug therapy: Secondary | ICD-10-CM | POA: Diagnosis not present

## 2022-04-30 DIAGNOSIS — D72829 Elevated white blood cell count, unspecified: Secondary | ICD-10-CM | POA: Diagnosis not present

## 2022-04-30 DIAGNOSIS — K582 Mixed irritable bowel syndrome: Secondary | ICD-10-CM | POA: Diagnosis not present

## 2022-04-30 DIAGNOSIS — E669 Obesity, unspecified: Secondary | ICD-10-CM | POA: Diagnosis not present

## 2022-04-30 DIAGNOSIS — K5900 Constipation, unspecified: Secondary | ICD-10-CM | POA: Diagnosis not present

## 2022-04-30 DIAGNOSIS — R198 Other specified symptoms and signs involving the digestive system and abdomen: Secondary | ICD-10-CM | POA: Diagnosis not present

## 2022-04-30 DIAGNOSIS — R110 Nausea: Secondary | ICD-10-CM | POA: Diagnosis not present

## 2022-04-30 DIAGNOSIS — R1084 Generalized abdominal pain: Secondary | ICD-10-CM | POA: Diagnosis not present

## 2022-04-30 DIAGNOSIS — G4089 Other seizures: Secondary | ICD-10-CM | POA: Diagnosis not present

## 2022-04-30 NOTE — ED Triage Notes (Signed)
Pt presents c/o rectal pain x1 week, pt does report blood in stools x2 weeks prior, has been taking miralax, pt states spasms are so bad she can't go to the bathroom. Pt states if she does not prop feet up in stool she has trouble urinating & bowel movements. Pelvic floor feels tight. Pt in severe pain, tearful during triage.

## 2022-04-30 NOTE — ED Provider Notes (Signed)
[facility]    CSN: [id number] Arrival date & time: 04/30/22  1426      History   Chief Complaint Chief Complaint  Patient presents with   Rectal Pain   Abdominal Pain    HPI Ruth Haley is a 31 y.o. female.   HPI  31 year old female here for evaluation of rectal and abdominal pain.  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      Home Medications    Prior to Admission medications   Medication Sig Start Date End Date Taking? Authorizing Provider  nortriptyline (PAMELOR) 10 MG capsule Take 30 mg by mouth at bedtime.    [provider]  paragard intrauterine copper IUD IUD ParaGard T 380A 380 square mm intrauterine device  Take 1 device by intrauterine route.    [provider]    Family History Family History  Problem Relation Age of Onset   Crohn's disease Father    Colitis Father    Congenital heart disease Father    Leukemia Father    Arthritis/Rheumatoid Father    Lung disease Father     Social History Social History   Tobacco Use   Smoking status: Former   Smokeless tobacco: Never  Substance Use Topics   Alcohol use: No   Drug use: No     Allergies   Morphine and related and Adhesive [tape]   Review of Systems Review of Systems   Physical Exam Triage Vital Signs ED Triage Vitals [04/30/22 1433]  Enc Vitals Group     BP 118/72     Pulse Rate (!) 107     Resp      Temp      Temp src      SpO2 98 %     Weight 210 lb (95.3 kg)     Height 5\' 2"  (1.575 m)     Head Circumference      Peak Flow      Pain Score 10     Pain Loc      Pain Edu?      Excl. in GC?    No  data found.  Updated Vital Signs BP 118/72   Pulse (!) 107   Ht 5\' 2"  (1.575 m)   Wt 210 lb (95.3 kg)   SpO2 98%   BMI 38.41 kg/m   Visual Acuity Right Eye Distance:   Left Eye Distance:   Bilateral Distance:    Right Eye Near:   Left Eye Near:    Bilateral Near:     Physical Exam   UC Treatments / Results  Labs (all labs ordered are listed, but only abnormal results are displayed) Labs Reviewed - No data to display  EKG   Radiology No results found.  Procedures Procedures (including critical care time)  Medications Ordered in UC Medications - No data to display  Initial Impression / Assessment and Plan / UC Course  I have reviewed the triage vital signs and the nursing notes.  Pertinent labs & imaging results that were available during my care of the patient were reviewed by me  and considered in my medical decision making (see chart for details).   I was called to the room during triage the patient because of the extreme amount of pain she was in.  When into the room I found the patient to be hyperventilating complaining of abdominal pain, rectal pain, and inability to pass stool or urine.  She has a history of rectal fissures and also IBS.  She has been using MiraLAX to help her have a bowel movement.  She states she has been having bowel movements daily and has not seen any blood in her stool.  She also denies fevers.  Patient reports that today she has had such intense pain that she has been unable to have a bowel movement or urinate.  When she does attempt a bowel movement she has to pull her legs to her chest because she is having such intense spasms but she is still unable to void.  She is followed by gastroenterology at [facility].  Due to the extreme amount of pain the patient's and, coupled with her inability to pass stool or past urine, I feel she would be better served to be evaluated in the emergency department.  She has pain medication so that an  adequate physical exam can be performed as well as possibility of complex imaging and/or urinary catheterization if she is found to have urine retention.  Patient and her significant other both agree.  They have elected to travel via POV to [place].  I did offer EMS but patient says that her insurance would not pay for an ambulance and she would have to travel via POV.   Final Clinical Impressions(s) / UC Diagnoses   Final diagnoses:  Abdominal pain, unspecified abdominal location  Rectal pain  Inability to urinate     Discharge Instructions      As we discussed, you are in a significant amount of pain and we do not have anything here at the urgent care to treat your pain with.  I feel he would be better served to be evaluated in the emergency department where they can give you pain medication so that an adequate physical exam can be performed as well as advanced diagnostic imaging or urinary catheterization as is determined necessary.     ED Prescriptions   None    PDMP not reviewed this encounter.   Margarette Canada, NP 04/30/22 1442

## 2022-04-30 NOTE — Discharge Instructions (Addendum)
As we discussed, you are in a significant amount of pain and we do not have anything here at the urgent care to treat your pain with.  I feel he would be better served to be evaluated in the emergency department where they can give you pain medication so that an adequate physical exam can be performed as well as advanced diagnostic imaging or urinary catheterization as is determined necessary.

## 2022-04-30 NOTE — ED Notes (Signed)
Patient is being discharged from the Urgent Care and sent to the [facility] Emergency Department via private vehicle . Per Margarette Canada, NP, patient is in need of higher level of care due to severe abdominal pain. Patient is aware and verbalizes understanding of plan of care.  Vitals:   04/30/22 1433  BP: 118/72  Pulse: (!) 107  SpO2: 98%

## 2022-05-01 DIAGNOSIS — Z6838 Body mass index (BMI) 38.0-38.9, adult: Secondary | ICD-10-CM | POA: Diagnosis not present

## 2022-05-01 DIAGNOSIS — R198 Other specified symptoms and signs involving the digestive system and abdomen: Secondary | ICD-10-CM | POA: Diagnosis not present

## 2022-05-08 DIAGNOSIS — K6289 Other specified diseases of anus and rectum: Secondary | ICD-10-CM | POA: Diagnosis not present

## 2022-05-08 DIAGNOSIS — E611 Iron deficiency: Secondary | ICD-10-CM | POA: Diagnosis not present

## 2022-05-08 DIAGNOSIS — K581 Irritable bowel syndrome with constipation: Secondary | ICD-10-CM | POA: Diagnosis not present

## 2022-05-08 DIAGNOSIS — M6289 Other specified disorders of muscle: Secondary | ICD-10-CM | POA: Diagnosis not present

## 2022-05-09 ENCOUNTER — Encounter: Payer: Self-pay | Admitting: Otolaryngology

## 2022-05-09 ENCOUNTER — Ambulatory Visit: Payer: BC Managed Care – PPO | Admitting: Anesthesiology

## 2022-05-09 ENCOUNTER — Other Ambulatory Visit: Payer: Self-pay

## 2022-05-09 ENCOUNTER — Ambulatory Visit
Admission: RE | Admit: 2022-05-09 | Discharge: 2022-05-09 | Disposition: A | Discharge status: Home / Self Care | Payer: BC Managed Care – PPO | Source: Ambulatory Visit | Attending: Otolaryngology | Admitting: Otolaryngology

## 2022-05-09 ENCOUNTER — Encounter
Admission: RE | Disposition: A | Discharge status: Home / Self Care | Payer: Self-pay | Source: Ambulatory Visit | Attending: Otolaryngology

## 2022-05-09 DIAGNOSIS — J3501 Chronic tonsillitis: Secondary | ICD-10-CM | POA: Insufficient documentation

## 2022-05-09 DIAGNOSIS — Z87891 Personal history of nicotine dependence: Secondary | ICD-10-CM | POA: Diagnosis not present

## 2022-05-09 DIAGNOSIS — E669 Obesity, unspecified: Secondary | ICD-10-CM | POA: Diagnosis not present

## 2022-05-09 DIAGNOSIS — J351 Hypertrophy of tonsils: Secondary | ICD-10-CM | POA: Diagnosis not present

## 2022-05-09 DIAGNOSIS — Z6837 Body mass index (BMI) 37.0-37.9, adult: Secondary | ICD-10-CM | POA: Diagnosis not present

## 2022-05-09 HISTORY — DX: Conversion disorder with seizures or convulsions: F44.5

## 2022-05-09 HISTORY — PX: TONSILLECTOMY: SHX5217

## 2022-05-09 LAB — POCT PREGNANCY, URINE: Preg Test, Ur: NEGATIVE

## 2022-05-09 SURGERY — TONSILLECTOMY
Anesthesia: General | Laterality: Bilateral

## 2022-05-09 MED ORDER — OXYCODONE HCL 5 MG PO TABS: 5.0000 mg | ORAL_TABLET | Freq: Once | ORAL | Status: AC | PRN

## 2022-05-09 MED ORDER — FENTANYL CITRATE PF 50 MCG/ML IJ SOSY
25.0000 ug | PREFILLED_SYRINGE | INTRAMUSCULAR | Status: DC | PRN
  Administered 2022-05-09 (×2): 50 ug via INTRAVENOUS
  Administered 2022-05-09: 25 ug via INTRAVENOUS

## 2022-05-09 MED ORDER — FENTANYL CITRATE (PF) 100 MCG/2ML IJ SOLN
INTRAMUSCULAR | Status: DC | PRN
  Administered 2022-05-09 (×4): 50 ug via INTRAVENOUS

## 2022-05-09 MED ORDER — LACTATED RINGERS IV SOLN: INTRAVENOUS | Status: DC

## 2022-05-09 MED ORDER — ACETAMINOPHEN 10 MG/ML IV SOLN
1000.0000 mg | Freq: Once | INTRAVENOUS | Status: AC
  Administered 2022-05-09: 1000 mg via INTRAVENOUS

## 2022-05-09 MED ORDER — SUCCINYLCHOLINE CHLORIDE 200 MG/10ML IV SOSY
PREFILLED_SYRINGE | INTRAVENOUS | Status: DC | PRN
  Administered 2022-05-09: 100 mg via INTRAVENOUS

## 2022-05-09 MED ORDER — OXYCODONE HCL 5 MG/5ML PO SOLN
5.0000 mg | Freq: Once | ORAL | Status: AC | PRN
  Administered 2022-05-09: 5 mg via ORAL

## 2022-05-09 MED ORDER — ONDANSETRON HCL 4 MG/2ML IJ SOLN
INTRAMUSCULAR | Status: DC | PRN
  Administered 2022-05-09: 4 mg via INTRAVENOUS

## 2022-05-09 MED ORDER — PROPOFOL 10 MG/ML IV BOLUS
INTRAVENOUS | Status: DC | PRN
  Administered 2022-05-09: 180 mg via INTRAVENOUS

## 2022-05-09 MED ORDER — BUPIVACAINE HCL 0.25 % IJ SOLN
INTRAMUSCULAR | Status: DC | PRN
  Administered 2022-05-09: 3 mL

## 2022-05-09 MED ORDER — 0.9 % SODIUM CHLORIDE (POUR BTL) OPTIME
TOPICAL | Status: DC | PRN
  Administered 2022-05-09: 500 mL

## 2022-05-09 MED ORDER — MIDAZOLAM HCL 5 MG/5ML IJ SOLN
INTRAMUSCULAR | Status: DC | PRN
  Administered 2022-05-09: 2 mg via INTRAVENOUS

## 2022-05-09 MED ORDER — PROMETHAZINE HCL 25 MG/ML IJ SOLN
6.2500 mg | Freq: Four times a day (QID) | INTRAMUSCULAR | Status: DC | PRN
  Administered 2022-05-09: 6.25 mg via INTRAVENOUS

## 2022-05-09 MED ORDER — DEXMEDETOMIDINE HCL IN NACL 200 MCG/50ML IV SOLN
INTRAVENOUS | Status: DC | PRN
  Administered 2022-05-09 (×5): 4 ug via INTRAVENOUS

## 2022-05-09 MED ORDER — DEXAMETHASONE SODIUM PHOSPHATE 4 MG/ML IJ SOLN
INTRAMUSCULAR | Status: DC | PRN
  Administered 2022-05-09: 8 mg via INTRAVENOUS

## 2022-05-09 MED ORDER — OXYMETAZOLINE HCL 0.05 % NA SOLN
NASAL | Status: DC | PRN
  Administered 2022-05-09: 1 via TOPICAL

## 2022-05-09 MED ORDER — HYDROCODONE-ACETAMINOPHEN 7.5-325 MG/15ML PO SOLN: ORAL | 0 refills | Status: DC

## 2022-05-09 MED ORDER — LIDOCAINE HCL (CARDIAC) PF 100 MG/5ML IV SOSY
PREFILLED_SYRINGE | INTRAVENOUS | Status: DC | PRN
  Administered 2022-05-09: 100 mg via INTRAVENOUS

## 2022-05-09 MED ORDER — PREDNISOLONE SODIUM PHOSPHATE 15 MG/5ML PO SOLN: ORAL | 0 refills | Status: DC

## 2022-05-09 SURGICAL SUPPLY — 15 items
BLADE ELECT COATED/INSUL 125 (ELECTRODE) ×1 IMPLANT
CANISTER SUCT 1200ML W/VALVE (MISCELLANEOUS) ×1 IMPLANT
CATH ROBINSON RED A/P 10FR (CATHETERS) ×1 IMPLANT
ELECT REM PT RETURN 9FT ADLT (ELECTROSURGICAL) ×1
ELECTRODE REM PT RTRN 9FT ADLT (ELECTROSURGICAL) ×1 IMPLANT
GLOVE SURG ENC MOIS LTX SZ7.5 (GLOVE) ×1 IMPLANT
KIT TURNOVER KIT A (KITS) ×1 IMPLANT
NS IRRIG 500ML POUR BTL (IV SOLUTION) ×1 IMPLANT
PACK TONSIL AND ADENOID CUSTOM (PACKS) ×1 IMPLANT
PENCIL SMOKE EVACUATOR (MISCELLANEOUS) ×1 IMPLANT
SLEEVE SUCTION 125 (MISCELLANEOUS) ×1 IMPLANT
SOL ANTI-FOG 6CC FOG-OUT (MISCELLANEOUS) ×1 IMPLANT
SOL FOG-OUT ANTI-FOG 6CC (MISCELLANEOUS) ×1
SPONGE TONSIL 1 RF SGL (DISPOSABLE) IMPLANT
STRAP BODY AND KNEE 60X3 (MISCELLANEOUS) ×1 IMPLANT

## 2022-05-09 NOTE — H&P (Signed)
History and physical reviewed and will be scanned in later. No change in medical status reported by the patient or family, appears stable for surgery. All questions regarding the procedure answered, and patient (or family if a child) expressed understanding of the procedure. ? ?Ruth Haley S Pearlena Ow ?@TODAY@ ?

## 2022-05-09 NOTE — Anesthesia Postprocedure Evaluation (Signed)
Anesthesia Post Note  Patient: Ruth Haley  Procedure(s) Performed: TONSILLECTOMY (Bilateral)  Patient location during evaluation: PACU Anesthesia Type: General Level of consciousness: awake and alert Pain management: pain level controlled Vital Signs Assessment: post-procedure vital signs reviewed and stable Respiratory status: spontaneous breathing, nonlabored ventilation, respiratory function stable and patient connected to nasal cannula oxygen Cardiovascular status: blood pressure returned to baseline and stable Postop Assessment: no apparent nausea or vomiting Anesthetic complications: no   No notable events documented.   Last Vitals:  Vitals:   05/09/22 0909 05/09/22 0915  BP:  111/71  Pulse: (!) 101 72  Resp: 13 12  Temp:    SpO2: 100% 98%    Last Pain:  Vitals:   05/09/22 0915  TempSrc:   PainSc: Asleep                 Ilene Qua

## 2022-05-09 NOTE — Anesthesia Preprocedure Evaluation (Signed)
Anesthesia Evaluation  Patient identified by MRN, date of birth, ID band Patient awake    Reviewed: Allergy & Precautions, NPO status , Patient's Chart, lab work & pertinent test results  Airway Mallampati: II  TM Distance: >3 FB Neck ROM: Full    Dental  (+) Teeth Intact   Pulmonary neg pulmonary ROS, former smoker,    Pulmonary exam normal breath sounds clear to auscultation       Cardiovascular Exercise Tolerance: Good Normal cardiovascular exam Rhythm:Regular Rate:Normal  Reports history of tachycardia, does not take any medications, no syncope associated     Neuro/Psych  Headaches, Seizures - (pyschogenic nonepileptic seizures ),  negative psych ROS   GI/Hepatic negative GI ROS, Neg liver ROS,   Endo/Other  negative endocrine ROS  Renal/GU negative Renal ROS  negative genitourinary   Musculoskeletal negative musculoskeletal ROS (+)   Abdominal   Peds negative pediatric ROS (+)  Hematology negative hematology ROS (+)   Anesthesia Other Findings   Reproductive/Obstetrics negative OB ROS                            Anesthesia Physical Anesthesia Plan  ASA: 2  Anesthesia Plan: General ETT   Post-op Pain Management: Tylenol PO (post-op), Toradol IV (intra-op) and Ketamine IV   Induction: Intravenous  PONV Risk Score and Plan: 4 or greater and Dexamethasone, Ondansetron, Treatment may vary due to age or medical condition and Midazolam  Airway Management Planned: Oral ETT  Additional Equipment:   Intra-op Plan:   Post-operative Plan: Extubation in OR  Informed Consent: I have reviewed the patients History and Physical, chart, labs and discussed the procedure including the risks, benefits and alternatives for the proposed anesthesia with the patient or authorized representative who has indicated his/her understanding and acceptance.     Dental Advisory Given  Plan Discussed  with: Anesthesiologist, CRNA and Surgeon  Anesthesia Plan Comments: (Patient consented for risks of anesthesia including but not limited to:  - adverse reactions to medications - damage to eyes, teeth, lips or other oral mucosa - nerve damage due to positioning  - sore throat or hoarseness - Damage to heart, brain, nerves, lungs, other parts of body or loss of life  Patient voiced understanding.)        Anesthesia Quick Evaluation

## 2022-05-09 NOTE — Op Note (Signed)
05/09/2022  8:22 AM    Ruth Haley  [id number]   Pre-Op Diagnosis:  Chronic Tonsillitis  Post-op Diagnosis: Chronic Tonsillitis  Procedure: Tonsillectomy  Surgeon:  Riley Nearing., MD  Anesthesia:  General endotracheal  EBL:  Less than 25 cc  Complications:  None  Findings: 2+ cryptic tonsils  Procedure: The patient was taken to the Operating Room and placed in the supine position.  After induction of general endotracheal anesthesia, the table was turned 90 degrees and the patient was draped in the usual fashion  with the eyes protected.  A mouth gag was inserted into the oral cavity to open the mouth, and examination of the oropharynx showed the uvula was non-bifid. The palate was palpated, and there was no evidence of submucous cleft. Examination of the nasopharynx showed no obstructing adenoids. The right tonsil was grasped with an Allis clamp and resected from the tonsillar fossa in the usual fashion with the Bovie. The left tonsil was resected in the same fashion. The Bovie was used to obtain hemostasis. Each tonsillar fossa was then carefully injected with 0.25% marcaine , avoiding intravascular injection. The nose and throat were irrigated and suctioned to remove any  blood clot. The mouth gag was  removed with no evidence of active bleeding.  The patient was then returned to the anesthesiologist for awakening, and was taken to the Recovery Room in stable condition.  Cultures:  None.  Specimens:  Tonsils.  Disposition:   PACU to home  Plan: Soft, bland diet and push fluids. Take pain medications and prednisolone as prescribed. No strenuous activity for 2 weeks. Follow-up in 3 weeks.  Riley Nearing 05/09/2022 8:22 AM 2

## 2022-05-09 NOTE — Anesthesia Procedure Notes (Signed)
Procedure Name: Intubation Date/Time: 05/09/2022 8:48 AM  Performed by: Moises Blood, CRNAPre-anesthesia Checklist: Patient identified, Emergency Drugs available, Suction available, Patient being monitored and Timeout performed Patient Re-evaluated:Patient Re-evaluated prior to induction Oxygen Delivery Method: Circle system utilized Preoxygenation: Pre-oxygenation with 100% oxygen Induction Type: IV induction Ventilation: Mask ventilation without difficulty Laryngoscope Size: Mac and 3 Grade View: Grade I Tube type: Oral Rae Number of attempts: 1 Airway Equipment and Method: Stylet Placement Confirmation: ETT inserted through vocal cords under direct vision, positive ETCO2, CO2 detector and breath sounds checked- equal and bilateral Secured at: 23 cm Tube secured with: Tape

## 2022-05-09 NOTE — Transfer of Care (Signed)
Immediate Anesthesia Transfer of Care Note  Patient: Ruth Haley  Procedure(s) Performed: TONSILLECTOMY (Bilateral)  Patient Location: PACU  Anesthesia Type: General ETT  Level of Consciousness: awake, alert  and patient cooperative  Airway and Oxygen Therapy: Patient Spontanous Breathing and Patient connected to supplemental oxygen  Post-op Assessment: Post-op Vital signs reviewed, Patient's Cardiovascular Status Stable, Respiratory Function Stable, Patent Airway and No signs of Nausea or vomiting  Post-op Vital Signs: Reviewed and stable  Complications: No notable events documented.

## 2022-05-10 ENCOUNTER — Encounter: Payer: Self-pay | Admitting: Otolaryngology

## 2022-05-11 LAB — SURGICAL PATHOLOGY

## 2022-05-22 DIAGNOSIS — F329 Major depressive disorder, single episode, unspecified: Secondary | ICD-10-CM | POA: Diagnosis not present

## 2022-05-24 DIAGNOSIS — N920 Excessive and frequent menstruation with regular cycle: Secondary | ICD-10-CM | POA: Diagnosis not present

## 2022-05-24 DIAGNOSIS — N926 Irregular menstruation, unspecified: Secondary | ICD-10-CM | POA: Diagnosis not present

## 2022-05-29 DIAGNOSIS — F329 Major depressive disorder, single episode, unspecified: Secondary | ICD-10-CM | POA: Diagnosis not present

## 2022-06-19 DIAGNOSIS — R4789 Other speech disturbances: Secondary | ICD-10-CM | POA: Diagnosis not present

## 2022-06-19 DIAGNOSIS — G43009 Migraine without aura, not intractable, without status migrainosus: Secondary | ICD-10-CM | POA: Diagnosis not present

## 2022-06-19 DIAGNOSIS — G479 Sleep disorder, unspecified: Secondary | ICD-10-CM | POA: Diagnosis not present

## 2022-06-19 DIAGNOSIS — Z30433 Encounter for removal and reinsertion of intrauterine contraceptive device: Secondary | ICD-10-CM | POA: Diagnosis not present

## 2022-06-19 DIAGNOSIS — F329 Major depressive disorder, single episode, unspecified: Secondary | ICD-10-CM | POA: Diagnosis not present

## 2022-06-19 DIAGNOSIS — F411 Generalized anxiety disorder: Secondary | ICD-10-CM | POA: Diagnosis not present

## 2022-07-10 DIAGNOSIS — F329 Major depressive disorder, single episode, unspecified: Secondary | ICD-10-CM | POA: Diagnosis not present

## 2022-07-24 DIAGNOSIS — F329 Major depressive disorder, single episode, unspecified: Secondary | ICD-10-CM | POA: Diagnosis not present

## 2023-03-13 NOTE — Progress Notes (Signed)
This encounter was created in error - please disregard.

## 2023-06-28 DIAGNOSIS — R569 Unspecified convulsions: Secondary | ICD-10-CM | POA: Diagnosis not present

## 2023-06-28 DIAGNOSIS — G43009 Migraine without aura, not intractable, without status migrainosus: Secondary | ICD-10-CM | POA: Diagnosis not present

## 2023-06-28 DIAGNOSIS — R4789 Other speech disturbances: Secondary | ICD-10-CM | POA: Diagnosis not present

## 2023-06-28 DIAGNOSIS — F411 Generalized anxiety disorder: Secondary | ICD-10-CM | POA: Diagnosis not present

## 2023-07-12 ENCOUNTER — Encounter: Payer: Self-pay | Admitting: Family Medicine

## 2023-07-12 ENCOUNTER — Ambulatory Visit: Payer: BC Managed Care – PPO | Admitting: Family Medicine

## 2023-07-12 VITALS — BP 118/75 | HR 95 | Resp 16 | Ht 62.0 in | Wt 225.0 lb

## 2023-07-12 DIAGNOSIS — Z6841 Body Mass Index (BMI) 40.0 and over, adult: Secondary | ICD-10-CM

## 2023-07-12 DIAGNOSIS — K219 Gastro-esophageal reflux disease without esophagitis: Secondary | ICD-10-CM

## 2023-07-12 DIAGNOSIS — K582 Mixed irritable bowel syndrome: Secondary | ICD-10-CM | POA: Diagnosis not present

## 2023-07-12 DIAGNOSIS — N944 Primary dysmenorrhea: Secondary | ICD-10-CM | POA: Insufficient documentation

## 2023-07-12 DIAGNOSIS — N920 Excessive and frequent menstruation with regular cycle: Secondary | ICD-10-CM

## 2023-07-12 DIAGNOSIS — G43109 Migraine with aura, not intractable, without status migrainosus: Secondary | ICD-10-CM | POA: Insufficient documentation

## 2023-07-12 DIAGNOSIS — R011 Cardiac murmur, unspecified: Secondary | ICD-10-CM

## 2023-07-12 DIAGNOSIS — F445 Conversion disorder with seizures or convulsions: Secondary | ICD-10-CM | POA: Diagnosis not present

## 2023-07-12 NOTE — Progress Notes (Signed)
New patient visit   Patient: Ruth Haley   DOB: Apr 26, 1991   32 y.o. Female  MRN: [id number] Visit Date: 07/12/2023  Today's healthcare provider: Ronnald Ramp, MD   Chief Complaint  Patient presents with   Establish Care        Subjective    Ruth Haley is a 32 y.o. female who presents today as a new patient to establish care.   HPI     Establish Care    Additional comments:        Last edited by Ashok Cordia, CMA on 07/12/2023  1:26 PM.       Discussed the use of AI scribe software for clinical note transcription with the patient, who gave verbal consent to proceed.  History of Present Illness   The patient, with a history of Irritable Bowel Syndrome (IBS), acid reflux, psychogenic non-epileptic seizures, and an unspecified cardiac condition, presents for an initial consultation. The patient's IBS, a combination type, is monitored closely due to a family history of Crohn's disease and colitis. The patient's seizures are managed with clonazepam and nortriptyline, and she describes her episodes as variable, including loss of language, staring, convulsions, fainting, and occasional screaming. The patient experiences auras before seizures, allowing her to prepare for the episode. Post-seizure, the patient takes additional clonazepam and rests, usually returning to baseline after sleep.  The patient also suffers from migraines, which have been well-controlled with medication, reducing the frequency to approximately once a month. Breakthrough migraines are usually triggered by exhaustion or stress. The patient also reports a history of severe menstrual cycles, lasting between eight to twelve days with heavy bleeding and large clots. Despite attempts to manage this with various forms of birth control, the patient has returned to using a copper IUD due to adverse effects from hormonal methods.  The patient has struggled with weight management throughout her life,  describing a history of disordered eating when attempting to lose weight. Despite maintaining a healthy diet and regular exercise, the patient reports difficulty in losing weight. The patient expresses a desire for her clothes to fit more comfortably rather than focusing on a specific weight loss goal.  The patient's gastrointestinal symptoms have been managed by a gastroenterologist, but the patient is in the process of establishing care with a new provider. The patient has had an endoscopy and colonoscopy within the past two years and was hospitalized last year due to intestinal issues. The patient's neurological and gynecological conditions are managed by other specialists.          Past Medical History:  Diagnosis Date   Allergy    Arthritis    GERD (gastroesophageal reflux disease)    Headache    Heart murmur    IBS (irritable bowel syndrome)    Psychogenic nonepileptic seizure     Outpatient Medications Prior to Visit  Medication Sig   clonazePAM (KLONOPIN) 0.5 MG tablet Take 0.25 mg by mouth daily.   nortriptyline (PAMELOR) 10 MG capsule Take 20 mg by mouth 2 (two) times daily.   paragard intrauterine copper IUD IUD ParaGard T 380A 380 square mm intrauterine device  Take 1 device by intrauterine route.   [DISCONTINUED] dicyclomine (BENTYL) 10 MG capsule Take 10 mg by mouth 4 (four) times daily -  before meals and at bedtime. PRN   [DISCONTINUED] HYDROcodone-acetaminophen (HYCET) 7.5-325 mg/15 ml solution 10-15 cc PO every 4-6 hours as needed for pain   [DISCONTINUED] prednisoLONE (ORAPRED) 15 MG/5ML solution 10 cc PO  BID x 3 days, then 5 cc PO BID x 3 days, then 5 cc PO QD x 3 days   No facility-administered medications prior to visit.    Past Surgical History:  Procedure Laterality Date   CESAREAN SECTION     2015   TONSILLECTOMY Bilateral 05/09/2022   Procedure: TONSILLECTOMY;  Surgeon: Geanie Logan, MD;  Location: [facility];  Service: ENT;  Laterality:  Bilateral;   Family Status  Relation Name Status   Father  Deceased  No partnership data on file   Family History  Problem Relation Age of Onset   Crohn's disease Father    Colitis Father    Congenital heart disease Father    Leukemia Father    Arthritis/Rheumatoid Father    Lung disease Father    Social History   Socioeconomic History   Marital status: Married    Spouse name: Not on file   Number of children: Not on file   Years of education: Not on file   Highest education level: Not on file  Occupational History   Not on file  Tobacco Use   Smoking status: Never   Smokeless tobacco: Never  Vaping Use   Vaping status: Never Used  Substance and Sexual Activity   Alcohol use: No   Drug use: No   Sexual activity: Yes    Birth control/protection: I.U.D.  Other Topics Concern   Not on file  Social History Narrative   Not on file   Social Determinants of Health   Financial Resource Strain: Low Risk  (07/12/2023)   Overall Financial Resource Strain (CARDIA)    Difficulty of Paying Living Expenses: Not hard at all  Food Insecurity: No Food Insecurity (07/12/2023)   Hunger Vital Sign    Worried About Running Out of Food in the Last Year: Never true    Ran Out of Food in the Last Year: Never true  Transportation Needs: No Transportation Needs (07/12/2023)   PRAPARE - Administrator, Civil Service (Medical): No    Lack of Transportation (Non-Medical): No  Physical Activity: Insufficiently Active (07/12/2023)   Exercise Vital Sign    Days of Exercise per Week: 2 days    Minutes of Exercise per Session: 60 min  Stress: No Stress Concern Present (07/12/2023)   [employer] of Occupational Health - Occupational Stress Questionnaire    Feeling of Stress : Not at all  Social Connections: Socially Integrated (07/12/2023)   Social Connection and Isolation Panel [NHANES]    Frequency of Communication with Friends and Family: More than three times a week     Frequency of Social Gatherings with Friends and Family: Twice a week    Attends Religious Services: More than 4 times per year    Active Member of [employer] or Organizations: Yes    Attends Engineer, structural: More than 4 times per year    Marital Status: Married     Allergies  Allergen Reactions   Morphine And Codeine     Feels like brain is on fire   Adhesive [Tape]      There is no immunization history on file for this patient.  Health Maintenance  Topic Date Due   HIV Screening  Never done   Hepatitis C Screening  Never done   DTaP/Tdap/Td (1 - Tdap) Never done   Cervical Cancer Screening (HPV/Pap Cotest)  Never done   INFLUENZA VACCINE  03/08/2023   COVID-19 Vaccine (1 - 2023-24 season) Never  done   HPV VACCINES  Aged Out    Patient Care Team: Ronnald Ramp, MD as PCP - General (Family Medicine) Anda Kraft, FNP (Gynecology) Morene Crocker, MD as Referring Physician (Neurology)  Review of Systems  Last CBC Lab Results  Component Value Date   WBC 11.8 (H) 07/07/2015   HGB 11.8 (L) 07/07/2015   HCT 37.3 07/07/2015   MCV 81.8 07/07/2015   MCH 25.7 (L) 07/07/2015   RDW 18.6 (H) 07/07/2015   PLT 382 07/07/2015   Last metabolic panel Lab Results  Component Value Date   GLUCOSE 89 07/07/2015   NA 140 07/07/2015   K 3.8 07/07/2015   CL 110 07/07/2015   CO2 22 07/07/2015   BUN 17 07/07/2015   CREATININE 0.73 07/07/2015   GFRNONAA >60 07/07/2015   CALCIUM 8.8 (L) 07/07/2015   PROT 6.4 (L) 07/07/2015   ALBUMIN 3.2 (L) 07/07/2015   BILITOT <0.1 (L) 07/07/2015   ALKPHOS 78 07/07/2015   AST 22 07/07/2015   ALT 17 07/07/2015   ANIONGAP 8 07/07/2015   Last lipids No results found for: "CHOL", "HDL", "LDLCALC", "LDLDIRECT", "TRIG", "CHOLHDL" Last hemoglobin A1c No results found for: "HGBA1C"      Objective    BP 118/75   Pulse 95   Resp 16   Ht 5\' 2"  (1.575 m)   Wt 225 lb (102.1 kg)   BMI 41.15 kg/m   BP Readings  from Last 3 Encounters:  07/12/23 118/75  05/09/22 120/72  04/30/22 118/72   Wt Readings from Last 3 Encounters:  07/12/23 225 lb (102.1 kg)  05/09/22 207 lb (93.9 kg)  04/30/22 210 lb (95.3 kg)        Depression Screen    07/12/2023    1:23 PM  PHQ 2/9 Scores  PHQ - 2 Score 0  PHQ- 9 Score 0   No results found for any visits on 07/12/23.   Physical Exam General: Alert, no acute distress Neck: no thyromegaly, no tenderness on exam   Cardio: Normal S1 and S2, RRR, no r/m/g on exam today Pulm: CTAB, normal work of breathing Abdomen: Bowel sounds normal. Abdomen soft and non-tender.  Extremities: No peripheral edema.      Assessment & Plan      Problem List Items Addressed This Visit       Cardiovascular and Mediastinum   Migraine with aura and without status migrainosus, not intractable    Migraines Well-managed with current medications, reduced to about once a month. Triggers include stress and sleep deprivation. - Continue current medications - Follow up with neurologist every three months      Relevant Medications   clonazePAM (KLONOPIN) 0.5 MG tablet     Digestive   Irritable bowel syndrome with both constipation and diarrhea    Combination IBS with alternating constipation and diarrhea. Recent endoscopy and colonoscopy within the last two years due to family history of Crohn's and colitis. - Reestablish care with gastroenterologist in [place] - Continue current management as per gastroenterologist's recommendations      Gastroesophageal reflux disease - Primary    Chronic condition managed by gastroenterologist. - Continue current management as per gastroenterologist's recommendations        Nervous and Auditory   Psychogenic nonepileptic seizure    Managed with clonazepam and nortriptyline. Seizures involve loss of language, staring, convulsions, and blackouts, with auras and 24-hour recovery period. Additional half dose of clonazepam taken  during seizures. - Continue current medications (clonazepam and nortriptyline) -  Follow up with neurologist every three months      Relevant Medications   clonazePAM (KLONOPIN) 0.5 MG tablet     Genitourinary   Primary dysmenorrhea    Heavy, painful periods lasting 8-12 days, managed with copper IUD. Hormonal IUD and birth control pills were ineffective and caused adverse effects. Periods can trigger seizures due to severe migraines. - Continue with copper IUD - Follow up with OBGYN as needed        Other   Menorrhagia with regular cycle   Heart murmur    Heart rate fluctuations managed by cardiologist. No current medication due to risk of lifelong dependency, patient prefers non-pharmacological management. - Continue monitoring as per cardiologist's recommendations      Body mass index (BMI) 40.0-44.9, adult (HCC)    Difficulty losing weight despite diet and exercise, with history of disordered eating. Discussed potential use of weight loss medications (Wegovy, Zepbound) pending insurance approval. Risks include potential exacerbation of IBS symptoms; benefits include potential weight loss and improved quality of life. Alternatives include phentermine, though not preferred due to cardiac risks. - Check with insurance for coverage of Wegovy or Zepbound - Order complete metabolic panel, liver, kidney, electrolytes, A1c, and thyroid studies - Follow up in four weeks if starting weight loss medication - Schedule annual physical exam      Relevant Orders   Hemoglobin A1c   CMP14+EGFR   TSH+T4F+T3Free        General Health Maintenance Routine health maintenance discussed, including annual physical exams and vaccinations. - Schedule annual physical exam - Review and update vaccinations as needed.       Return in about 6 weeks (around 08/23/2023) for Weight MGMT.      Ronnald Ramp, MD  [facility] [phone] (phone) [phone]  (fax)  [facility]

## 2023-07-12 NOTE — Patient Instructions (Signed)
VISIT SUMMARY:  During today's visit, we discussed your ongoing health concerns, including your Irritable Bowel Syndrome (IBS), acid reflux, psychogenic non-epileptic seizures, migraines, cardiac arrhythmia, menorrhagia, and weight management. We reviewed your current treatment plans and made recommendations for follow-up care with your specialists.  YOUR PLAN:  -IRRITABLE BOWEL SYNDROME (IBS): IBS is a chronic condition that affects your digestive system, causing symptoms like stomach cramps, bloating, diarrhea, and constipation. We recommend you reestablish care with a gastroenterologist in [place] and continue your current management plan as advised by your previous gastroenterologist.  -ACID REFLUX: Acid reflux is a condition where stomach acid frequently flows back into the tube connecting your mouth and stomach, causing heartburn and other symptoms. Please continue with your current management plan as recommended by your gastroenterologist.  -PSYCHOGENIC NON-EPILEPTIC SEIZURES: These are episodes that resemble epileptic seizures but are not caused by electrical disruptions in the brain. They are managed with clonazepam and nortriptyline. Continue taking your current medications and follow up with your neurologist every three months.  -MIGRAINES: Migraines are severe headaches often accompanied by nausea, vomiting, and sensitivity to light and sound. Your migraines are well-managed with your current medications. Please continue with these medications and follow up with your neurologist every three months.  -CARDIAC ARRHYTHMIA: This is a condition where your heart beats irregularly. You are currently managing this without medication to avoid lifelong dependency. Continue monitoring your condition as per your cardiologist's recommendations.  -MENORRHAGIA: Menorrhagia is abnormally heavy and prolonged menstrual bleeding. You are managing this with a copper IUD. Please continue with this method  and follow up with your OBGYN as needed.  -OBESITY: Obesity is a condition characterized by excessive body fat. We discussed potential weight loss medications like Wegovy or Zepbound, pending insurance approval. We will also order a complete metabolic panel and other relevant tests. Follow up in four weeks if you start weight loss medication.  -GENERAL HEALTH MAINTENANCE: We discussed the importance of routine health maintenance, including annual physical exams and vaccinations. Please schedule your annual physical exam and review your vaccinations as needed.  INSTRUCTIONS:  Please reestablish care with a gastroenterologist in [facility] for your IBS and acid reflux. Continue your current medications for psychogenic non-epileptic seizures and migraines, and follow up with your neurologist every three months. Monitor your cardiac arrhythmia as per your cardiologist's recommendations. Continue using the copper IUD for menorrhagia and follow up with your OBGYN as needed. Check with your insurance for coverage of Wegovy or Zepbound for weight management, and we will order a complete metabolic panel and other relevant tests. Follow up in four weeks if you start weight loss medication. Schedule your annual physical exam and review your vaccinations.

## 2023-07-13 DIAGNOSIS — Z6841 Body Mass Index (BMI) 40.0 and over, adult: Secondary | ICD-10-CM | POA: Insufficient documentation

## 2023-07-13 NOTE — Assessment & Plan Note (Signed)
Migraines Well-managed with current medications, reduced to about once a month. Triggers include stress and sleep deprivation. - Continue current medications - Follow up with neurologist every three months

## 2023-07-13 NOTE — Assessment & Plan Note (Signed)
Heart rate fluctuations managed by cardiologist. No current medication due to risk of lifelong dependency, patient prefers non-pharmacological management. - Continue monitoring as per cardiologist's recommendations

## 2023-07-13 NOTE — Assessment & Plan Note (Signed)
Managed with clonazepam and nortriptyline. Seizures involve loss of language, staring, convulsions, and blackouts, with auras and 24-hour recovery period. Additional half dose of clonazepam taken during seizures. - Continue current medications (clonazepam and nortriptyline) - Follow up with neurologist every three months

## 2023-07-13 NOTE — Assessment & Plan Note (Signed)
Chronic condition managed by gastroenterologist. - Continue current management as per gastroenterologist's recommendations

## 2023-07-13 NOTE — Assessment & Plan Note (Signed)
Difficulty losing weight despite diet and exercise, with history of disordered eating. Discussed potential use of weight loss medications (Wegovy, Zepbound) pending insurance approval. Risks include potential exacerbation of IBS symptoms; benefits include potential weight loss and improved quality of life. Alternatives include phentermine, though not preferred due to cardiac risks. - Check with insurance for coverage of Wegovy or Zepbound - Order complete metabolic panel, liver, kidney, electrolytes, A1c, and thyroid studies - Follow up in four weeks if starting weight loss medication - Schedule annual physical exam

## 2023-07-13 NOTE — Assessment & Plan Note (Signed)
Heavy, painful periods lasting 8-12 days, managed with copper IUD. Hormonal IUD and birth control pills were ineffective and caused adverse effects. Periods can trigger seizures due to severe migraines. - Continue with copper IUD - Follow up with OBGYN as needed

## 2023-07-13 NOTE — Assessment & Plan Note (Signed)
Combination IBS with alternating constipation and diarrhea. Recent endoscopy and colonoscopy within the last two years due to family history of Crohn's and colitis. - Reestablish care with gastroenterologist in [place] - Continue current management as per gastroenterologist's recommendations

## 2023-07-14 DIAGNOSIS — M542 Cervicalgia: Secondary | ICD-10-CM | POA: Diagnosis not present

## 2023-07-16 ENCOUNTER — Encounter: Payer: Self-pay | Admitting: Family Medicine

## 2023-07-17 DIAGNOSIS — Z6841 Body Mass Index (BMI) 40.0 and over, adult: Secondary | ICD-10-CM | POA: Diagnosis not present

## 2023-07-18 LAB — CMP14+EGFR
ALT: 14 [IU]/L (ref 0–32)
AST: 13 [IU]/L (ref 0–40)
Albumin: 4 g/dL (ref 3.9–4.9)
Alkaline Phosphatase: 73 [IU]/L (ref 44–121)
BUN/Creatinine Ratio: 15 (ref 9–23)
BUN: 15 mg/dL (ref 6–20)
Bilirubin Total: 0.2 mg/dL (ref 0.0–1.2)
CO2: 22 mmol/L (ref 20–29)
Calcium: 9.2 mg/dL (ref 8.7–10.2)
Chloride: 103 mmol/L (ref 96–106)
Creatinine, Ser: 1.01 mg/dL — ABNORMAL HIGH (ref 0.57–1.00)
Globulin, Total: 3.2 g/dL (ref 1.5–4.5)
Glucose: 79 mg/dL (ref 70–99)
Potassium: 4.3 mmol/L (ref 3.5–5.2)
Sodium: 140 mmol/L (ref 134–144)
Total Protein: 7.2 g/dL (ref 6.0–8.5)
eGFR: 75 mL/min/{1.73_m2} (ref >59)

## 2023-07-18 LAB — HEMOGLOBIN A1C
Est. average glucose Bld gHb Est-mCnc: 108 mg/dL
Hgb A1c MFr Bld: 5.4 % (ref 4.8–5.6)

## 2023-07-18 LAB — TSH+T4F+T3FREE
Free T4: 1.19 ng/dL (ref 0.82–1.77)
T3, Free: 2.6 pg/mL (ref 2.0–4.4)
TSH: 1.73 u[IU]/mL (ref 0.450–4.500)

## 2023-07-22 ENCOUNTER — Other Ambulatory Visit: Payer: Self-pay | Admitting: Family Medicine

## 2023-07-22 DIAGNOSIS — Z6841 Body Mass Index (BMI) 40.0 and over, adult: Secondary | ICD-10-CM

## 2023-07-22 MED ORDER — SEMAGLUTIDE-WEIGHT MANAGEMENT 0.25 MG/0.5ML ~~LOC~~ SOAJ: 0.2500 mg | SUBCUTANEOUS | 0 refills | Status: AC

## 2023-07-22 MED ORDER — SEMAGLUTIDE-WEIGHT MANAGEMENT 0.5 MG/0.5ML ~~LOC~~ SOAJ: 0.5000 mg | SUBCUTANEOUS | 0 refills | Status: DC

## 2023-07-23 ENCOUNTER — Telehealth: Payer: Self-pay | Admitting: Family Medicine

## 2023-07-23 NOTE — Telephone Encounter (Signed)
Received fax from [facility] on [address] for prior auth on Ozempic (0.25mg .&0.5) 2 mg/59ml pen  [employer] Member ID  [id number] Group:  [id number] Ins. Contact #  [phone]

## 2023-07-23 NOTE — Telephone Encounter (Signed)
Received fax from [facility] [address]  Need prior auth on Wegovy 0.5mg     [employer] member ID [id number] Group:  [id number]  Ins Contact # [phone]

## 2023-07-27 NOTE — Telephone Encounter (Signed)
No PA needed for ozempic due to no diagnosis of DM   Proceed with PA for wegovy for obesity management

## 2023-07-27 NOTE — Telephone Encounter (Signed)
I am not going to proceed with PA on Ozempic. Patient doesn't have a diagnosis of diabetes. Insurance is not going to cover it if she is not diabetic.

## 2023-07-27 NOTE — Telephone Encounter (Signed)
KeyJanine Limbo PA for [facility] started and notes faxed

## 2023-07-30 ENCOUNTER — Telehealth: Payer: Self-pay | Admitting: Family Medicine

## 2023-07-30 NOTE — Telephone Encounter (Signed)
[facility] prescription has been approved for obesity management

## 2023-07-30 NOTE — Telephone Encounter (Signed)
Patient notified. She will contact pharmacy to fill the prescription.

## 2023-08-06 ENCOUNTER — Ambulatory Visit: Payer: BC Managed Care – PPO | Admitting: Physician Assistant

## 2023-08-06 ENCOUNTER — Ambulatory Visit: Payer: Self-pay | Admitting: *Deleted

## 2023-08-06 ENCOUNTER — Encounter: Payer: Self-pay | Admitting: Physician Assistant

## 2023-08-06 VITALS — BP 122/70 | HR 91 | Resp 16 | Ht 62.0 in | Wt 225.0 lb

## 2023-08-06 DIAGNOSIS — H9203 Otalgia, bilateral: Secondary | ICD-10-CM | POA: Diagnosis not present

## 2023-08-06 MED ORDER — CLOTRIMAZOLE-BETAMETHASONE 1-0.05 % EX CREA: 1.0000 | TOPICAL_CREAM | Freq: Every day | CUTANEOUS | 0 refills | Status: AC

## 2023-08-06 NOTE — Progress Notes (Signed)
Acute Office Visit   Patient: Ruth Haley   DOB: [birth year]   32 y.o. Female  MRN: [id number] Visit Date: 08/06/2023  Today's healthcare provider: Oswaldo Conroy Jesyka Slaght, PA-C  Introduced myself to the patient as a Secondary school teacher and provided education on APPs in clinical practice.    Chief Complaint  Patient presents with   Ear Pain    B/L, started yesterday. Sensitive to noise and painful.   Subjective    HPI HPI     Ear Pain    Additional comments: B/L, started yesterday. Sensitive to noise and painful.      Last edited by Dollene Primrose, CMA on 08/06/2023 12:50 PM.      She reports she is having bilateral ear pain since last night She states she has previous hx of adult ear infections and was using a steroid cream in ears to assist with allergies but has stopped using this and switched to oral antihistamine  She denies periauricular swelling or discomfort  Reports her last ear infection was over a year ago prior to tonsillectomy   Medications: Outpatient Medications Prior to Visit  Medication Sig   clonazePAM (KLONOPIN) 0.5 MG tablet Take 0.25 mg by mouth daily.   nortriptyline (PAMELOR) 10 MG capsule Take 20 mg by mouth 2 (two) times daily.   paragard intrauterine copper IUD IUD ParaGard T 380A 380 square mm intrauterine device  Take 1 device by intrauterine route.   Semaglutide-Weight Management 0.25 MG/0.5ML SOAJ Inject 0.25 mg into the skin once a week for 28 days.   [START ON 08/20/2023] Semaglutide-Weight Management 0.5 MG/0.5ML SOAJ Inject 0.5 mg into the skin once a week for 28 days.   No facility-administered medications prior to visit.    Review of Systems  Constitutional:  Negative for chills and fever.  HENT:  Positive for congestion, ear pain, rhinorrhea and sore throat. Negative for ear discharge, hearing loss and tinnitus.   Neurological:  Positive for headaches.  Hematological:  Negative for adenopathy.        Objective    BP 122/70   Pulse 91    Resp 16   Ht 5\' 2"  (1.575 m)   Wt 225 lb (102.1 kg)   SpO2 99%   BMI 41.15 kg/m     Physical Exam Vitals reviewed.  Constitutional:      Appearance: Normal appearance.  HENT:     Head: Normocephalic and atraumatic.     Right Ear: Hearing, tympanic membrane and ear canal normal. Tympanic membrane is not injected, scarred, perforated, erythematous, retracted or bulging.     Left Ear: Hearing, tympanic membrane and ear canal normal. Tympanic membrane is not injected, scarred, perforated, erythematous, retracted or bulging.     Mouth/Throat:     Lips: Pink.     Mouth: Mucous membranes are moist.     Pharynx: Oropharynx is clear. Uvula midline. No pharyngeal swelling, oropharyngeal exudate, posterior oropharyngeal erythema or uvula swelling.  Lymphadenopathy:     Head:     Right side of head: No submental, submandibular, preauricular or posterior auricular adenopathy.     Left side of head: No submental, submandibular, preauricular or posterior auricular adenopathy.     Cervical:     Right cervical: No superficial cervical adenopathy.    Left cervical: No superficial cervical adenopathy.     Upper Body:     Right upper body: No supraclavicular adenopathy.     Left upper body: No supraclavicular adenopathy.  Neurological:     Mental Status: She is alert.       No results found for any visits on 08/06/23.  Assessment & Plan      No follow-ups on file.       Problem List Items Addressed This Visit   None Visit Diagnoses       Ear discomfort, bilateral    -  Primary   Relevant Medications   clotrimazole-betamethasone (LOTRISONE) cream      Acute, new concern She reports ear discomfort bilaterally and states she has pain even if wind blows along her face Physical exam is overall benign Suspect potential Eustachian tube dysfunction - recommend oral antihistamine and flonase to assist with symptoms Will refill her lotrisone to assist with allergic dermatitis along  external ear per request Reviewed ED and return precautions Follow up as needed for persistent or progressing symptoms    No follow-ups on file.   I, Tilley Faeth E Ramsey Guadamuz, PA-C, have reviewed all documentation for this visit. The documentation on 08/06/23 for the exam, diagnosis, procedures, and orders are all accurate and complete.   Jacquelin Hawking, MHS, PA-C [facility]

## 2023-08-06 NOTE — Patient Instructions (Signed)
  I also recommend adding an antihistamine to your daily regimen  This includes medications like Claritin, Allegra, Zyrtec- the generics of these work very well and are usually less expensive  I recommend using Flonase nasal spray - 2 puffs twice per day to help with your nasal congestion The antihistamines and Flonase can take a few weeks to provide significant relief from allergy symptoms but should start to provide some benefit soon.  

## 2023-08-06 NOTE — Telephone Encounter (Signed)
  Chief Complaint: bilateral ear pain  Symptoms: ringing in ears, "hurts to hear" , sensitive to noise, headache, stuffy nose. Pain with swallowing from ears to where tonsils removed.  Frequency: yesterday  Pertinent Negatives: Patient denies fever no drainage  Disposition: [] ED /[] Urgent Care (no appt availability in office) / [x] Appointment(In office/virtual)/ []  [facility]/ [] Home Care/ [] Refused Recommended Disposition /[] [facility]/ []  Follow-up with PCP Additional Notes:   No available appt with PCP. [facility] appt scheduled with E. Mecum, PA today       Reason for Disposition  [1] SEVERE pain AND [2] not improved 2 hours after taking analgesic medication (e.g., ibuprofen or acetaminophen)  Answer Assessment - Initial Assessment Questions 1. LOCATION: "Which ear is involved?"     Both ears left hurts worse  2. ONSET: "When did the ear start hurting"      Yesterday  3. SEVERITY: "How bad is the pain?"  (Scale 1-10; mild, moderate or severe)   - MILD (1-3): doesn't interfere with normal activities    - MODERATE (4-7): interferes with normal activities or awakens from sleep    - SEVERE (8-10): excruciating pain, unable to do any normal activities      severe 4. URI SYMPTOMS: "Do you have a runny nose or cough?"     Headache stuffy nose  5. FEVER: "Do you have a fever?" If Yes, ask: "What is your temperature, how was it measured, and when did it start?"     Na  6. CAUSE: "Have you been swimming recently?", "How often do you use Q-TIPS?", "Have you had any recent air travel or scuba diving?"     na 7. OTHER SYMPTOMS: "Do you have any other symptoms?" (e.g., headache, stiff neck, dizziness, vomiting, runny nose, decreased hearing)     Headache stuffy nose ringing in ears 8. PREGNANCY: "Is there any chance you are pregnant?" "When was your last menstrual period?"     na  Protocols used: Davina Poke

## 2023-08-17 ENCOUNTER — Encounter: Payer: Self-pay | Admitting: Family Medicine

## 2023-08-20 ENCOUNTER — Ambulatory Visit: Payer: BC Managed Care – PPO | Admitting: Family Medicine

## 2023-08-20 MED ORDER — ONDANSETRON 4 MG PO TBDP: 4.0000 mg | ORAL_TABLET | Freq: Three times a day (TID) | ORAL | 0 refills | Status: DC | PRN

## 2023-08-23 ENCOUNTER — Encounter: Payer: Self-pay | Admitting: Family Medicine

## 2023-08-23 ENCOUNTER — Ambulatory Visit: Payer: BC Managed Care – PPO | Admitting: Family Medicine

## 2023-08-23 VITALS — BP 125/85 | HR 102 | Ht 62.0 in | Wt 219.0 lb

## 2023-08-23 DIAGNOSIS — Z6841 Body Mass Index (BMI) 40.0 and over, adult: Secondary | ICD-10-CM | POA: Diagnosis not present

## 2023-08-23 DIAGNOSIS — K219 Gastro-esophageal reflux disease without esophagitis: Secondary | ICD-10-CM

## 2023-08-23 DIAGNOSIS — K582 Mixed irritable bowel syndrome: Secondary | ICD-10-CM | POA: Diagnosis not present

## 2023-08-23 MED ORDER — PANTOPRAZOLE SODIUM 40 MG PO TBEC: 40.0000 mg | DELAYED_RELEASE_TABLET | Freq: Every day | ORAL | 3 refills | Status: DC

## 2023-08-23 MED ORDER — SEMAGLUTIDE-WEIGHT MANAGEMENT 0.5 MG/0.5ML ~~LOC~~ SOAJ: 0.5000 mg | SUBCUTANEOUS | 1 refills | Status: DC

## 2023-08-23 NOTE — Assessment & Plan Note (Signed)
33 year old following up for weight management. Currently on Wegovy 0.5 mg once weekly. Weight decreased from 225 lbs to 219 lbs, BMI reduction from 41.15 to 40. Significant gastrointestinal side effects including nausea, vomiting, bloating, and gas, potentially exacerbated by IBS. Insomnia reported. Discussed risks of continuing Wegovy, including persistent gastrointestinal symptoms and potential exacerbation of IBS. Benefits include weight loss and improved BMI. Considered alternative dosing schedule to mitigate side effects. Patient prefers to continue current dose with close monitoring. - Continue Wegovy 0.5 mg once weekly - Consider spacing out Wegovy to every other week if symptoms persist - Prescribe Zofran for nausea as needed - Recommend melatonin for insomnia, starting at a low dose - Follow up in three months, or sooner if symptoms worsen

## 2023-08-23 NOTE — Progress Notes (Signed)
Established patient visit   Patient: Ruth Haley   DOB: Dec 09, 1990   33 y.o. Female  MRN: [id number] Visit Date: 08/23/2023  Today's healthcare provider: Ronnald Ramp, MD   Chief Complaint  Patient presents with   Follow-up    F/u--Wegovy-had nausea but is better.   Subjective     HPI     Follow-up    Additional comments: F/u--Wegovy-had nausea but is better.      Last edited by Shelly Bombard, CMA on 08/23/2023  8:59 AM.       Discussed the use of AI scribe software for clinical note transcription with the patient, who gave verbal consent to proceed.  History of Present Illness   The patient, a 33 year old female with a history of irritable bowel syndrome (IBS), is currently on Wegovy 0.5 mg once weekly for weight management. The patient has lost 6 pounds since the last visit, with a starting weight of 225 pounds and a current weight of 219 pounds. The patient's BMI has decreased from 41.15 in December 2024 to 40 today.  The patient reports experiencing nausea for the first three weeks after starting [facility], particularly the day after the injection. However, the patient experienced a severe episode of gastrointestinal upset after consuming an [place] chicken dish. The patient describes immediate discomfort, bloating, and gas that lasted from Friday to Tuesday, with sulfur burps and severe abdominal pain. The patient was unable to find relief with Gas-X. The discomfort was so severe that the patient left work early on Tuesday. After consuming a small meal on Tuesday evening, the patient experienced nausea and vomiting around midnight, expelling all food consumed during the day. Following this episode, the patient reports feeling better with no further abdominal discomfort or gas.  The patient also reports experiencing insomnia, waking up between 2 AM and 5 AM and being unable to fall back asleep. The patient has not tried any sleep aids to manage this symptom.  The  patient has a history of heartburn, which was a daily occurrence before starting Wegovy. The patient reports that the heartburn has improved since starting the medication. The patient also has a history of IBS, which may have contributed to the severe gastrointestinal upset experienced after consuming the [place] chicken dish. The patient is considering adjusting the [facility] dosage schedule to manage these side effects.      Past Medical History:  Diagnosis Date   Allergy    Arthritis    GERD (gastroesophageal reflux disease)    Headache    Heart murmur    IBS (irritable bowel syndrome)    Psychogenic nonepileptic seizure     Medications: Outpatient Medications Prior to Visit  Medication Sig   clonazePAM (KLONOPIN) 0.5 MG tablet Take 0.25 mg by mouth daily.   clotrimazole-betamethasone (LOTRISONE) cream Apply 1 Application topically daily.   nortriptyline (PAMELOR) 10 MG capsule Take 20 mg by mouth 2 (two) times daily.   ondansetron (ZOFRAN-ODT) 4 MG disintegrating tablet Take 1 tablet (4 mg total) by mouth every 8 (eight) hours as needed for nausea or vomiting.   paragard intrauterine copper IUD IUD ParaGard T 380A 380 square mm intrauterine device  Take 1 device by intrauterine route.   [DISCONTINUED] Semaglutide-Weight Management 0.5 MG/0.5ML SOAJ Inject 0.5 mg into the skin once a week for 28 days.   No facility-administered medications prior to visit.    Review of Systems  Last metabolic panel Lab Results  Component Value Date   GLUCOSE 79  07/17/2023   NA 140 07/17/2023   K 4.3 07/17/2023   CL 103 07/17/2023   CO2 22 07/17/2023   BUN 15 07/17/2023   CREATININE 1.01 (H) 07/17/2023   EGFR 75 07/17/2023   CALCIUM 9.2 07/17/2023   PROT 7.2 07/17/2023   ALBUMIN 4.0 07/17/2023   LABGLOB 3.2 07/17/2023   BILITOT 0.2 07/17/2023   ALKPHOS 73 07/17/2023   AST 13 07/17/2023   ALT 14 07/17/2023   ANIONGAP 8 07/07/2015   Last hemoglobin A1c Lab Results  Component Value Date    HGBA1C 5.4 07/17/2023        Objective    BP 125/85 (BP Location: Right Arm, Patient Position: Sitting, Cuff Size: Large)   Pulse (!) 102   Ht 5\' 2"  (1.575 m)   Wt 219 lb (99.3 kg)   SpO2 97%   BMI 40.06 kg/m  BP Readings from Last 3 Encounters:  08/23/23 125/85  08/06/23 122/70  07/12/23 118/75   Wt Readings from Last 3 Encounters:  08/23/23 219 lb (99.3 kg)  08/06/23 225 lb (102.1 kg)  07/12/23 225 lb (102.1 kg)        Physical Exam  General: Alert, no acute distress Cardio: Normal S1 and S2, RRR, no r/m/g Pulm: CTAB, normal work of breathing Abdomen: Bowel sounds normal. Abdomen soft, generalized tenderness (pt report abdominal tenderness at baseline)    No results found for any visits on 08/23/23.  Assessment & Plan     Problem List Items Addressed This Visit       Digestive   Irritable bowel syndrome with both constipation and diarrhea   IBS contributing to gastrointestinal symptoms with Wegovy. Symptoms include severe bloating, gas, and abdominal pain. Discussed dietary modifications to avoid trigger foods. Patient experienced relief after vomiting, indicating possible food-related triggers. - Monitor gastrointestinal symptoms - Consider dietary modifications to avoid trigger foods - Follow up as needed if symptoms persist         Relevant Medications   pantoprazole (PROTONIX) 40 MG tablet   Gastroesophageal reflux disease   Daily, chronic  heartburn, improved since starting Wegovy. Currently experiencing heartburn during the visit. Discussed use of Protonix for management. - Prescribe Protonix 40 mg daily, 30 minutes before the first meal       Relevant Medications   pantoprazole (PROTONIX) 40 MG tablet     Other   Body mass index (BMI) 40.0-44.9, adult (HCC) - Primary   33 year old following up for weight management. Currently on Wegovy 0.5 mg once weekly. Weight decreased from 225 lbs to 219 lbs, BMI reduction from 41.15 to 40. Significant  gastrointestinal side effects including nausea, vomiting, bloating, and gas, potentially exacerbated by IBS. Insomnia reported. Discussed risks of continuing Wegovy, including persistent gastrointestinal symptoms and potential exacerbation of IBS. Benefits include weight loss and improved BMI. Considered alternative dosing schedule to mitigate side effects. Patient prefers to continue current dose with close monitoring. - Continue Wegovy 0.5 mg once weekly - Consider spacing out Wegovy to every other week if symptoms persist - Prescribe Zofran for nausea as needed - Recommend melatonin for insomnia, starting at a low dose - Follow up in three months, or sooner if symptoms worsen      Relevant Medications   Semaglutide-Weight Management 0.5 MG/0.5ML SOAJ       Return in about 3 months (around 11/21/2023) for Weight MGMT.       Ronnald Ramp, MD  [facility] [phone] (phone) [phone] (fax)  [facility]

## 2023-08-23 NOTE — Assessment & Plan Note (Signed)
IBS contributing to gastrointestinal symptoms with Wegovy. Symptoms include severe bloating, gas, and abdominal pain. Discussed dietary modifications to avoid trigger foods. Patient experienced relief after vomiting, indicating possible food-related triggers. - Monitor gastrointestinal symptoms - Consider dietary modifications to avoid trigger foods - Follow up as needed if symptoms persist

## 2023-08-23 NOTE — Assessment & Plan Note (Signed)
Daily, chronic  heartburn, improved since starting Wegovy. Currently experiencing heartburn during the visit. Discussed use of Protonix for management. - Prescribe Protonix 40 mg daily, 30 minutes before the first meal

## 2023-09-27 ENCOUNTER — Encounter: Payer: Self-pay | Admitting: Family Medicine

## 2023-09-27 DIAGNOSIS — Z6841 Body Mass Index (BMI) 40.0 and over, adult: Secondary | ICD-10-CM

## 2023-09-28 MED ORDER — SEMAGLUTIDE-WEIGHT MANAGEMENT 0.25 MG/0.5ML ~~LOC~~ SOAJ: 0.2500 mg | SUBCUTANEOUS | 0 refills | Status: DC

## 2023-09-28 MED ORDER — ONDANSETRON 4 MG PO TBDP: 4.0000 mg | ORAL_TABLET | Freq: Three times a day (TID) | ORAL | 0 refills | Status: DC | PRN

## 2023-10-01 ENCOUNTER — Other Ambulatory Visit: Payer: Self-pay | Admitting: Family Medicine

## 2023-10-11 DIAGNOSIS — G43009 Migraine without aura, not intractable, without status migrainosus: Secondary | ICD-10-CM | POA: Diagnosis not present

## 2023-10-11 DIAGNOSIS — R4789 Other speech disturbances: Secondary | ICD-10-CM | POA: Diagnosis not present

## 2023-10-11 DIAGNOSIS — G40509 Epileptic seizures related to external causes, not intractable, without status epilepticus: Secondary | ICD-10-CM | POA: Diagnosis not present

## 2023-10-11 DIAGNOSIS — F411 Generalized anxiety disorder: Secondary | ICD-10-CM | POA: Diagnosis not present

## 2023-10-22 DIAGNOSIS — Z113 Encounter for screening for infections with a predominantly sexual mode of transmission: Secondary | ICD-10-CM | POA: Diagnosis not present

## 2023-10-22 DIAGNOSIS — Z1331 Encounter for screening for depression: Secondary | ICD-10-CM | POA: Diagnosis not present

## 2023-10-22 DIAGNOSIS — Z01419 Encounter for gynecological examination (general) (routine) without abnormal findings: Secondary | ICD-10-CM | POA: Diagnosis not present

## 2023-10-22 DIAGNOSIS — Z124 Encounter for screening for malignant neoplasm of cervix: Secondary | ICD-10-CM | POA: Diagnosis not present

## 2023-10-26 ENCOUNTER — Ambulatory Visit: Admitting: Family Medicine

## 2023-11-07 ENCOUNTER — Ambulatory Visit: Payer: BC Managed Care – PPO | Admitting: Family Medicine

## 2023-11-21 ENCOUNTER — Encounter: Payer: Self-pay | Admitting: Family Medicine

## 2023-12-06 ENCOUNTER — Encounter: Payer: Self-pay | Admitting: Family Medicine

## 2023-12-06 ENCOUNTER — Ambulatory Visit
Admission: RE | Admit: 2023-12-06 | Discharge: 2023-12-06 | Disposition: A | Discharge status: Home / Self Care | Attending: Family Medicine | Admitting: Family Medicine

## 2023-12-06 ENCOUNTER — Ambulatory Visit
Admission: RE | Admit: 2023-12-06 | Discharge: 2023-12-06 | Disposition: A | Discharge status: Home / Self Care | Source: Ambulatory Visit | Attending: Family Medicine | Admitting: Family Medicine

## 2023-12-06 ENCOUNTER — Ambulatory Visit: Admitting: Family Medicine

## 2023-12-06 VITALS — BP 113/70 | HR 92 | Ht 62.0 in | Wt 218.0 lb

## 2023-12-06 DIAGNOSIS — F445 Conversion disorder with seizures or convulsions: Secondary | ICD-10-CM

## 2023-12-06 DIAGNOSIS — M545 Low back pain, unspecified: Secondary | ICD-10-CM

## 2023-12-06 DIAGNOSIS — M2550 Pain in unspecified joint: Secondary | ICD-10-CM

## 2023-12-06 DIAGNOSIS — N920 Excessive and frequent menstruation with regular cycle: Secondary | ICD-10-CM | POA: Diagnosis not present

## 2023-12-06 DIAGNOSIS — Z6841 Body Mass Index (BMI) 40.0 and over, adult: Secondary | ICD-10-CM | POA: Diagnosis not present

## 2023-12-06 DIAGNOSIS — M47814 Spondylosis without myelopathy or radiculopathy, thoracic region: Secondary | ICD-10-CM | POA: Diagnosis not present

## 2023-12-06 NOTE — Progress Notes (Unsigned)
 Established patient visit   Patient: Ruth Haley   DOB: [birth year]   33 y.o. Female  MRN: [id number] Visit Date: 12/06/2023  Today's healthcare provider: Mimi Alt, MD   Chief Complaint  Patient presents with   Weight Check    Discuss a different weight loss treatment plan, due to the side effects of the last medication    Subjective     HPI     Weight Check    Additional comments: Discuss a different weight loss treatment plan, due to the side effects of the last medication       Last edited by Bart Lieu, CMA on 12/06/2023  9:25 AM.       Discussed the use of AI scribe software for clinical note transcription with the patient, who gave verbal consent to proceed.  History of Present Illness Ruth Haley is a 33 year old female who presents for follow-up regarding weight management.  She has a current BMI of 39.87 and a weight of 218 pounds, reflecting a decrease of 7 pounds since December 2024. She previously used semaglutide  but discontinued it due to severe nausea and vomiting. She is seeking alternative weight management options.  She engages in regular physical activity, attending the gym three to five days a week, and maintains a diet that includes two salads a day, protein shakes, and minimal fried foods. Despite these efforts, her weight fluctuates, and she experiences low energy levels, which she attributes to her previous medication regimen.  She has a history of psychogenic non-epileptic seizures, which precludes the use of certain medications like Wellbutrin. She has not tried phentermine before and is considering it as a potential option for weight loss.  She experiences joint pain in her fingers, wrists, ankles, and knees, which affects her ability to perform exercises, particularly on leg days. She also has persistent, excruciating pain in a specific spot on her spine that is sensitive to touch, causing her to avoid pressure on that  area.  Her menstrual cycle has been irregular since taking semaglutide , with unpredictable onset and prolonged duration of 8-10 days. She experiences significant premenstrual symptoms, including headaches and mood changes, which have been exacerbated recently.     Past Medical History:  Diagnosis Date   Allergy    Arthritis    GERD (gastroesophageal reflux disease)    Headache    Heart murmur    IBS (irritable bowel syndrome)    Psychogenic nonepileptic seizure     Medications: Outpatient Medications Prior to Visit  Medication Sig   clonazePAM (KLONOPIN) 0.5 MG tablet Take 0.25 mg by mouth daily.   clotrimazole -betamethasone  (LOTRISONE ) cream Apply 1 Application topically daily.   nortriptyline (PAMELOR) 10 MG capsule Take 20 mg by mouth 2 (two) times daily.   paragard  intrauterine copper  IUD IUD ParaGard  T 380A 380 square mm intrauterine device  Take 1 device by intrauterine route.   ondansetron  (ZOFRAN -ODT) 4 MG disintegrating tablet Take 1 tablet (4 mg total) by mouth every 8 (eight) hours as needed for nausea or vomiting. (Patient not taking: Reported on 12/06/2023)   pantoprazole  (PROTONIX ) 40 MG tablet Take 1 tablet (40 mg total) by mouth daily.   No facility-administered medications prior to visit.    Review of Systems  Last CBC Lab Results  Component Value Date   WBC 7.7 12/06/2023   HGB 8.4 (L) 12/06/2023   HCT 29.1 (L) 12/06/2023   MCV 69 (L) 12/06/2023   MCH 19.8 (L) 12/06/2023  RDW 17.7 (H) 12/06/2023   PLT 519 (H) 12/06/2023   Last metabolic panel Lab Results  Component Value Date   GLUCOSE 79 07/17/2023   NA 140 07/17/2023   K 4.3 07/17/2023   CL 103 07/17/2023   CO2 22 07/17/2023   BUN 15 07/17/2023   CREATININE 1.01 (H) 07/17/2023   EGFR 75 07/17/2023   CALCIUM 9.2 07/17/2023   PROT 7.2 07/17/2023   ALBUMIN 4.0 07/17/2023   LABGLOB 3.2 07/17/2023   BILITOT 0.2 07/17/2023   ALKPHOS 73 07/17/2023   AST 13 07/17/2023   ALT 14 07/17/2023    ANIONGAP 8 07/07/2015   Last lipids No results found for: "CHOL", "HDL", "LDLCALC", "LDLDIRECT", "TRIG", "CHOLHDL" Last hemoglobin A1c Lab Results  Component Value Date   HGBA1C 5.4 07/17/2023   Last thyroid  functions Lab Results  Component Value Date   TSH 1.730 07/17/2023        Objective    BP 113/70   Pulse 92   Ht 5\' 2"  (1.575 m)   Wt 218 lb (98.9 kg)   LMP 11/27/2023   SpO2 99%   BMI 39.87 kg/m  BP Readings from Last 3 Encounters:  12/06/23 113/70  08/23/23 125/85  08/06/23 122/70   Wt Readings from Last 3 Encounters:  12/06/23 218 lb (98.9 kg)  08/23/23 219 lb (99.3 kg)  08/06/23 225 lb (102.1 kg)   SpO2 Readings from Last 3 Encounters:  12/06/23 99%  08/23/23 97%  08/06/23 99%        Physical Exam  General: Alert, no acute distress Cardio: Normal S1 and S2, RRR, no r/m/g Pulm: CTAB, normal work of breathing ABD: soft, abdomen is not distended, there is no tenderness to palpation, normal BS    Results for orders placed or performed in visit on 12/06/23  Sed Rate (ESR)  Result Value Ref Range   Sed Rate 46 (H) 0 - 32 mm/hr  C-reactive protein  Result Value Ref Range   CRP 2 0 - 10 mg/L  Celiac Disease Panel  Result Value Ref Range   Endomysial IgA Negative Negative   Transglutaminase IgA WILL FOLLOW    IgA/Immunoglobulin A, Serum 226 87 - 352 mg/dL  ANA+ENA+DNA/DS+Scl 62+ZHYQMV/H  Result Value Ref Range   ANA Titer 1 WILL FOLLOW    dsDNA Ab 1 0 - 9 IU/mL   ENA RNP Ab <0.2 0.0 - 0.9 AI   ENA SM Ab Ser-aCnc <0.2 0.0 - 0.9 AI   Scleroderma (Scl-70) (ENA) Antibody, IgG <0.2 0.0 - 0.9 AI   ENA SSA (RO) Ab <0.2 0.0 - 0.9 AI   ENA SSB (LA) Ab <0.2 0.0 - 0.9 AI  Rheumatoid Factor  Result Value Ref Range   Rheumatoid fact SerPl-aCnc 10.2 <14.0 IU/mL  CBC  Result Value Ref Range   WBC 7.7 3.4 - 10.8 x10E3/uL   RBC 4.24 3.77 - 5.28 x10E6/uL   Hemoglobin 8.4 (L) 11.1 - 15.9 g/dL   Hematocrit 84.6 (L) 96.2 - 46.6 %   MCV 69 (L) 79 - 97 fL    MCH 19.8 (L) 26.6 - 33.0 pg   MCHC 28.9 (L) 31.5 - 35.7 g/dL   RDW 95.2 (H) 84.1 - 32.4 %   Platelets 519 (H) 150 - 450 x10E3/uL    Assessment & Plan     Problem List Items Addressed This Visit       Other   Tenderness of lumbar region   Chronic back pain with a specific area on  the spine that is extremely sensitive to touch, located in the central lumbar region. No recent imaging of the lumbar spine. History of hip issues post-pregnancy. Differential includes possible vertebral or soft tissue abnormalities. - Order lumbar spine x-ray. - Consider referral to orthopedist if x-ray indicates further evaluation is needed.      Relevant Orders   DG Lumbar Spine Complete (Completed)   Polyarthralgia   Recent onset of diffuse joint pain affecting fingers, wrists, ankles, knees, and hips. Symptoms include morning stiffness and pain exacerbated by physical activity. Differential includes autoimmune conditions such as lupus or rheumatoid arthritis, especially given family history of rheumatoid arthritis. - Order laboratory tests including rheumatoid factor, ANA, ENA, double-stranded DNA, CRP, ESR, and celiac panel to evaluate for autoimmune conditions.      Relevant Orders   Sed Rate (ESR) (Completed)   C-reactive protein (Completed)   Celiac Disease Panel (Completed)   ANA+ENA+DNA/DS+Scl 70+SjoSSA/B (Completed)   Rheumatoid Factor (Completed)   CBC (Completed)   Menorrhagia with regular cycle   Irregular menstruation Irregular menstruation with unpredictable cycles, prolonged duration, and severe premenstrual symptoms. Possible hormonal disruption post-semaglutide  use. Differential includes PCOS, though she has no history of infertility. - Monitor menstrual cycle for changes post-semaglutide  discontinuation. - Consider further evaluation for PCOS if symptoms persist.      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      Body mass index (BMI) 40.0-44.9, adult (HCC) - Primary   Obesity, chronic  with a BMI of 39.87, weight 218 pounds, down 7 pounds since December 2024. Previous intolerance to semaglutide  due to severe nausea and vomiting. Considering alternative pharmacological options for weight management. Discussed potential options including metformin, Topamax, naltrexone, and phentermine. 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 Phentermine discussed as a potential option with the risk of increased blood pressure and heart rate, but generally does not cause gastrointestinal issues. Metformin may cause diarrhea in most people but can result in 2-5% body weight loss. She is currently exercising 3-5 days a week and maintaining a healthy diet. - Provide information on phentermine. - Consider starting phentermine at a low dose, titrate up to 37.5 mg over three months if tolerated. - provided AVS info about phentermine for patient to review and consider if she wants to start this agent for her weight management  - Discussed potential referral to bariatric surgery clinic, but she is not interested at this time        Assessment & Plan     Return in about 1 month (around 01/06/2024) for Weight MGMT.         Mimi Alt, MD  [facility] [phone] (phone) [phone] (fax)  [facility]

## 2023-12-06 NOTE — Patient Instructions (Signed)
 Please report to [facility] located at:  [address]  [place], [place] [address]  You do not need an appointment to have xrays completed.   Our office will follow up with  results once available.

## 2023-12-07 ENCOUNTER — Encounter: Payer: Self-pay | Admitting: Family Medicine

## 2023-12-08 DIAGNOSIS — M2550 Pain in unspecified joint: Secondary | ICD-10-CM | POA: Insufficient documentation

## 2023-12-08 NOTE — Assessment & Plan Note (Signed)
 Chronic back pain with a specific area on the spine that is extremely sensitive to touch, located in the central lumbar region. No recent imaging of the lumbar spine. History of hip issues post-pregnancy. Differential includes possible vertebral or soft tissue abnormalities. - Order lumbar spine x-ray. - Consider referral to orthopedist if x-ray indicates further evaluation is needed.

## 2023-12-08 NOTE — Assessment & Plan Note (Signed)
 Irregular menstruation Irregular menstruation with unpredictable cycles, prolonged duration, and severe premenstrual symptoms. Possible hormonal disruption post-semaglutide  use. Differential includes PCOS, though she has no history of infertility. - Monitor menstrual cycle for changes post-semaglutide  discontinuation. - Consider further evaluation for PCOS if symptoms persist.

## 2023-12-08 NOTE — Assessment & Plan Note (Signed)
 Recent onset of diffuse joint pain affecting fingers, wrists, ankles, knees, and hips. Symptoms include morning stiffness and pain exacerbated by physical activity. Differential includes autoimmune conditions such as lupus or rheumatoid arthritis, especially given family history of rheumatoid arthritis. - Order laboratory tests including rheumatoid factor, ANA, ENA, double-stranded DNA, CRP, ESR, and celiac panel to evaluate for autoimmune conditions.

## 2023-12-08 NOTE — Assessment & Plan Note (Signed)
 Obesity, chronic  with a BMI of 39.87, weight 218 pounds, down 7 pounds since December 2024. Previous intolerance to semaglutide  due to severe nausea and vomiting. Considering alternative pharmacological options for weight management. Discussed potential options including metformin, Topamax, naltrexone, and phentermine. 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 Phentermine discussed as a potential option with the risk of increased blood pressure and heart rate, but generally does not cause gastrointestinal issues. Metformin may cause diarrhea in most people but can result in 2-5% body weight loss. She is currently exercising 3-5 days a week and maintaining a healthy diet. - Provide information on phentermine. - Consider starting phentermine at a low dose, titrate up to 37.5 mg over three months if tolerated. - provided AVS info about phentermine for patient to review and consider if she wants to start this agent for her weight management  - Discussed potential referral to bariatric surgery clinic, but she is not interested at this time

## 2023-12-08 NOTE — Assessment & Plan Note (Signed)
 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

## 2023-12-11 ENCOUNTER — Encounter: Payer: Self-pay | Admitting: Family Medicine

## 2023-12-13 LAB — CBC
Hematocrit: 29.1 % — ABNORMAL LOW (ref 34.0–46.6)
Hemoglobin: 8.4 g/dL — ABNORMAL LOW (ref 11.1–15.9)
MCH: 19.8 pg — ABNORMAL LOW (ref 26.6–33.0)
MCHC: 28.9 g/dL — ABNORMAL LOW (ref 31.5–35.7)
MCV: 69 fL — ABNORMAL LOW (ref 79–97)
Platelets: 519 10*3/uL — ABNORMAL HIGH (ref 150–450)
RBC: 4.24 x10E6/uL (ref 3.77–5.28)
RDW: 17.7 % — ABNORMAL HIGH (ref 11.7–15.4)
WBC: 7.7 10*3/uL (ref 3.4–10.8)

## 2023-12-13 LAB — CELIAC DISEASE PANEL
Endomysial IgA: NEGATIVE
IgA/Immunoglobulin A, Serum: 226 mg/dL (ref 87–352)

## 2023-12-13 LAB — ANA+ENA+DNA/DS+SCL 70+SJOSSA/B
ENA RNP Ab: <0.2 AI (ref 0.0–0.9)
ENA SM Ab Ser-aCnc: <0.2 AI (ref 0.0–0.9)
ENA SSA (RO) Ab: <0.2 AI (ref 0.0–0.9)
ENA SSB (LA) Ab: <0.2 AI (ref 0.0–0.9)
Scleroderma (Scl-70) (ENA) Antibody, IgG: <0.2 AI (ref 0.0–0.9)
dsDNA Ab: 1 [IU]/mL (ref 0–9)

## 2023-12-13 LAB — SEDIMENTATION RATE: Sed Rate: 46 mm/h — ABNORMAL HIGH (ref 0–32)

## 2023-12-13 LAB — C-REACTIVE PROTEIN: CRP: 2 mg/L (ref 0–10)

## 2023-12-13 LAB — RHEUMATOID FACTOR: Rheumatoid fact SerPl-aCnc: 10.2 [IU]/mL (ref <14.0)

## 2023-12-26 ENCOUNTER — Encounter: Admitting: Family Medicine

## 2024-01-08 ENCOUNTER — Ambulatory Visit: Admitting: Family Medicine

## 2024-02-15 ENCOUNTER — Encounter: Payer: Self-pay | Admitting: Family Medicine

## 2024-02-15 ENCOUNTER — Ambulatory Visit: Admitting: Family Medicine

## 2024-02-15 VITALS — BP 116/78 | HR 94 | Ht 62.0 in | Wt 225.0 lb

## 2024-02-15 DIAGNOSIS — M545 Low back pain, unspecified: Secondary | ICD-10-CM

## 2024-02-15 DIAGNOSIS — Z6841 Body Mass Index (BMI) 40.0 and over, adult: Secondary | ICD-10-CM

## 2024-02-15 DIAGNOSIS — M2550 Pain in unspecified joint: Secondary | ICD-10-CM | POA: Diagnosis not present

## 2024-02-15 MED ORDER — PHENTERMINE HCL 15 MG PO CAPS: 15.0000 mg | ORAL_CAPSULE | ORAL | 0 refills | Status: DC

## 2024-02-15 MED ORDER — DULOXETINE HCL 30 MG PO CPEP: ORAL_CAPSULE | ORAL | 3 refills | Status: DC

## 2024-02-15 MED ORDER — PHENTERMINE HCL 30 MG PO CAPS: 30.0000 mg | ORAL_CAPSULE | ORAL | 0 refills | Status: DC

## 2024-02-15 NOTE — Assessment & Plan Note (Signed)
 Chronic Joint Pain Persistent pain in hands, feet, and back, particularly in the mornings, with normal x-ray results, suggesting possible nerve or soft tissue involvement. - Initiate duloxetine  30 mg daily for one week, then increase to 60 mg daily. - Educate on signs of serotonin syndrome, such as high fever and muscle aches, and advise to discontinue duloxetine  if symptoms occur.

## 2024-02-15 NOTE — Assessment & Plan Note (Signed)
 Chronic Concern about weight management and previous adverse reactions to weight loss medications. Phentermine  considered due to limited options from GI issues and seizure history. Monitor heart rate and blood pressure due to stimulant effects, and reduce caffeine intake. Open to specialist referral if medication is ineffective, but hesitant about surgical options due to GI history and personal preferences. - Start phentermine  15 mg once daily, increase to 30 mg after one month if tolerated. - Advise to monitor heart rate and blood pressure regularly. - Recommend reducing caffeine intake while on phentermine . - Discuss potential referral to weight management specialist if medication is not effective.

## 2024-02-15 NOTE — Progress Notes (Signed)
 Established patient visit   Patient: Ruth Haley   DOB: 06/11/1991   33 y.o. Female  MRN: [id number] Visit Date: 02/15/2024  Today's healthcare provider: Rockie Agent, MD   Chief Complaint  Patient presents with   Weight Loss   Subjective       Discussed the use of AI scribe software for clinical note transcription with the patient, who gave verbal consent to proceed.  History of Present Illness Ruth Haley is a 33 year old female with chronic pain who presents with persistent pain in her hands, feet, and back.  She experiences persistent pain in her hands and feet, particularly noticeable upon waking in the morning. The pain is described as stiffness that requires movement to alleviate. Despite prior x-rays of her back showing no abnormalities, she continues to experience significant back pain, describing it as sensitive to touch and impacting her ability to engage in physical activities such as going to the gym.  She has a history of traveling for work, which has disrupted her routine and contributed to her feeling 'big' and unmotivated to exercise. Despite efforts, she has not seen any weight loss, which has been discouraging. She discusses her weight management challenges, noting a history of being 'big' since childhood and difficulty losing weight despite regular exercise and a controlled diet. She is concerned about potential side effects from weight management medications, particularly those affecting heart rate or gastrointestinal function.  She has a history of gastrointestinal sensitivity, particularly with certain medications. She can tolerate ibuprofen and acetaminophen  without gastrointestinal issues but is cautious about frequent use. She has a history of seizures and is cautious about medications that might affect her heart rate or interact with her seizure history. She recalls a past incident where excessive caffeine intake led to a blackout, indicating  sensitivity to stimulants.  She is currently taking nortriptyline.     Past Medical History:  Diagnosis Date   Allergy    Arthritis    GERD (gastroesophageal reflux disease)    Headache    Heart murmur    IBS (irritable bowel syndrome)    Psychogenic nonepileptic seizure     Medications: Outpatient Medications Prior to Visit  Medication Sig   clobetasol ointment (TEMOVATE) 0.05 % SMARTSIG:1-4 Gram(s) Topical Twice Daily   clonazePAM (KLONOPIN) 0.5 MG tablet Take 0.25 mg by mouth daily.   clotrimazole -betamethasone  (LOTRISONE ) cream Apply 1 Application topically daily.   nortriptyline (PAMELOR) 10 MG capsule Take 20 mg by mouth 2 (two) times daily.   ondansetron  (ZOFRAN -ODT) 4 MG disintegrating tablet Take 1 tablet (4 mg total) by mouth every 8 (eight) hours as needed for nausea or vomiting. (Patient not taking: Reported on 02/15/2024)   pantoprazole  (PROTONIX ) 40 MG tablet Take 1 tablet (40 mg total) by mouth daily.   paragard  intrauterine copper  IUD IUD ParaGard  T 380A 380 square mm intrauterine device  Take 1 device by intrauterine route.   No facility-administered medications prior to visit.    Review of Systems  Last CBC Lab Results  Component Value Date   WBC 7.7 12/06/2023   HGB 8.4 (L) 12/06/2023   HCT 29.1 (L) 12/06/2023   MCV 69 (L) 12/06/2023   MCH 19.8 (L) 12/06/2023   RDW 17.7 (H) 12/06/2023   PLT 519 (H) 12/06/2023   Last metabolic panel Lab Results  Component Value Date   GLUCOSE 79 07/17/2023   NA 140 07/17/2023   K 4.3 07/17/2023   CL 103 07/17/2023   CO2  22 07/17/2023   BUN 15 07/17/2023   CREATININE 1.01 (H) 07/17/2023   EGFR 75 07/17/2023   CALCIUM 9.2 07/17/2023   PROT 7.2 07/17/2023   ALBUMIN 4.0 07/17/2023   LABGLOB 3.2 07/17/2023   BILITOT 0.2 07/17/2023   ALKPHOS 73 07/17/2023   AST 13 07/17/2023   ALT 14 07/17/2023   ANIONGAP 8 07/07/2015   Last lipids No results found for: CHOL, HDL, LDLCALC, LDLDIRECT, TRIG,  CHOLHDL Last hemoglobin A1c Lab Results  Component Value Date   HGBA1C 5.4 07/17/2023        Objective    BP 116/78   Pulse 94   Ht 5' 2 (1.575 m)   Wt 225 lb (102.1 kg)   SpO2 99%   BMI 41.15 kg/m  BP Readings from Last 3 Encounters:  02/15/24 116/78  12/06/23 113/70  08/23/23 125/85   Wt Readings from Last 3 Encounters:  02/15/24 225 lb (102.1 kg)  12/06/23 218 lb (98.9 kg)  08/23/23 219 lb (99.3 kg)        Physical Exam Vitals reviewed.  Constitutional:      General: She is not in acute distress.    Appearance: Normal appearance. She is not ill-appearing.  Cardiovascular:     Rate and Rhythm: Normal rate and regular rhythm.  Pulmonary:     Effort: Pulmonary effort is normal. No respiratory distress.     Breath sounds: No wheezing, rhonchi or rales.  Neurological:     Mental Status: She is alert and oriented to person, place, and time.  Psychiatric:        Mood and Affect: Mood normal.        Behavior: Behavior normal.     Physical Exam      No results found for any visits on 02/15/24.  Assessment & Plan     Problem List Items Addressed This Visit       Other   Tenderness of lumbar region   Relevant Medications   DULoxetine  (CYMBALTA ) 30 MG capsule   Polyarthralgia   Chronic Joint Pain Persistent pain in hands, feet, and back, particularly in the mornings, with normal x-ray results, suggesting possible nerve or soft tissue involvement. - Initiate duloxetine  30 mg daily for one week, then increase to 60 mg daily. - Educate on signs of serotonin syndrome, such as high fever and muscle aches, and advise to discontinue duloxetine  if symptoms occur.      Relevant Medications   DULoxetine  (CYMBALTA ) 30 MG capsule   Body mass index (BMI) 40.0-44.9, adult (HCC) - Primary   Chronic Concern about weight management and previous adverse reactions to weight loss medications. Phentermine  considered due to limited options from GI issues and seizure  history. Monitor heart rate and blood pressure due to stimulant effects, and reduce caffeine intake. Open to specialist referral if medication is ineffective, but hesitant about surgical options due to GI history and personal preferences. - Start phentermine  15 mg once daily, increase to 30 mg after one month if tolerated. - Advise to monitor heart rate and blood pressure regularly. - Recommend reducing caffeine intake while on phentermine . - Discuss potential referral to weight management specialist if medication is not effective.      Relevant Medications   phentermine  15 MG capsule   phentermine  30 MG capsule (Start on 03/14/2024)     Assessment & Plan     Seizure Disorder Cautious about medications that may exacerbate seizures. Avoided Wellbutrin and topiramate due to potential seizure risk.  General Health Maintenance Lifestyle factors affecting health, including exercise and dietary habits, were discussed. Acknowledged efforts and challenges in maintaining a healthy lifestyle despite medical conditions. - Encourage continuation of healthy lifestyle practices, including regular exercise and balanced diet.     Return in about 6 weeks (around 03/28/2024) for Weight MGMT.         Rockie Agent, MD  [facility] [phone] (phone) [phone] (fax)  [facility]

## 2024-03-14 ENCOUNTER — Telehealth: Payer: Self-pay

## 2024-03-14 NOTE — Telephone Encounter (Signed)
 Pharmacy Patient Advocate Encounter   Received notification from CoverMyMeds that prior authorization for Phentermine  HCl 15MG  capsules is required/requested.   Insurance verification completed.   The patient is insured through [facility] .   PA would be required, but pt recently had positive pregnancy test, no PA submitted at this time

## 2024-03-27 ENCOUNTER — Other Ambulatory Visit: Payer: Self-pay | Admitting: Family Medicine

## 2024-03-27 ENCOUNTER — Ambulatory Visit

## 2024-03-27 DIAGNOSIS — T8332XA Displacement of intrauterine contraceptive device, initial encounter: Secondary | ICD-10-CM

## 2024-03-28 ENCOUNTER — Ambulatory Visit
Admission: RE | Admit: 2024-03-28 | Discharge: 2024-03-28 | Disposition: A | Discharge status: Home / Self Care | Source: Ambulatory Visit | Attending: Family Medicine | Admitting: Family Medicine

## 2024-03-28 ENCOUNTER — Ambulatory Visit
Admission: RE | Admit: 2024-03-28 | Discharge: 2024-03-28 | Disposition: A | Discharge status: Home / Self Care | Attending: Family Medicine | Admitting: Family Medicine

## 2024-03-28 DIAGNOSIS — T8332XA Displacement of intrauterine contraceptive device, initial encounter: Secondary | ICD-10-CM | POA: Diagnosis present

## 2024-04-03 ENCOUNTER — Ambulatory Visit: Admitting: Family Medicine

## 2024-04-17 ENCOUNTER — Other Ambulatory Visit: Payer: Self-pay | Admitting: Family Medicine

## 2024-04-17 DIAGNOSIS — K219 Gastro-esophageal reflux disease without esophagitis: Secondary | ICD-10-CM

## 2024-04-21 ENCOUNTER — Encounter: Payer: Self-pay | Admitting: Family Medicine

## 2024-04-21 ENCOUNTER — Other Ambulatory Visit: Payer: Self-pay | Admitting: Family Medicine

## 2024-04-21 DIAGNOSIS — K219 Gastro-esophageal reflux disease without esophagitis: Secondary | ICD-10-CM

## 2024-04-22 ENCOUNTER — Other Ambulatory Visit: Payer: Self-pay | Admitting: Family Medicine

## 2024-04-22 DIAGNOSIS — K219 Gastro-esophageal reflux disease without esophagitis: Secondary | ICD-10-CM

## 2024-04-22 MED ORDER — PANTOPRAZOLE SODIUM 40 MG PO TBEC: 40.0000 mg | DELAYED_RELEASE_TABLET | Freq: Two times a day (BID) | ORAL | 6 refills | Status: AC

## 2024-05-02 ENCOUNTER — Other Ambulatory Visit: Payer: Self-pay | Admitting: Physician Assistant

## 2024-05-02 DIAGNOSIS — H9203 Otalgia, bilateral: Secondary | ICD-10-CM

## 2024-05-06 NOTE — Telephone Encounter (Signed)
 Requested medication (s) are due for refill today: routing for review  Requested medication (s) are on the active medication list: yes  Last refill:  08/06/23  Future visit scheduled: no  Notes to clinic:  Medication not assigned to a protocol, review manually.      Requested Prescriptions  Pending Prescriptions Disp Refills   clotrimazole -betamethasone  (LOTRISONE ) cream [Pharmacy Med Name: Clotrimazole -Betamethasone  1-0.05 % External Cream] 30 g 0    Sig: APPLY 1 CREAM TOPICALLY ONCE DAILY     Off-Protocol Failed - 05/06/2024  8:26 AM      Failed - Medication not assigned to a protocol, review manually.      Passed - Valid encounter within last 12 months    Recent Outpatient Visits           2 months ago Body mass index (BMI) 40.0-44.9, adult [facility])   [place] [facility] Simmons-Robinson, [place], MD   5 months ago Body mass index (BMI) 40.0-44.9, adult [facility])   [facility] Sharma Coyer, MD

## 2024-07-21 ENCOUNTER — Telehealth

## 2024-07-22 ENCOUNTER — Telehealth

## 2024-07-22 DIAGNOSIS — L03031 Cellulitis of right toe: Secondary | ICD-10-CM

## 2024-07-22 MED ORDER — MUPIROCIN 2 % EX OINT: 1.0000 | TOPICAL_OINTMENT | Freq: Two times a day (BID) | CUTANEOUS | 0 refills | Status: AC

## 2024-07-22 NOTE — Patient Instructions (Signed)
 Ruth Haley, thank you for joining Elsie Velma Lunger, PA-C for today's virtual visit.  While this provider is not your primary care provider (PCP), if your PCP is located in our provider database this encounter information will be shared with them immediately following your visit.   A [facility] MyChart account gives you access to today's visit and all your visits, tests, and labs performed at [facility]  click here if you don't have a [facility] MyChart account or go to [email or web address]  Consent: (Patient) Ruth Haley provided verbal consent for this virtual visit at the beginning of the encounter.  Current Medications:  Current Outpatient Medications:    clobetasol ointment (TEMOVATE) 0.05 %, SMARTSIG:1-4 Gram(s) Topical Twice Daily, Disp: , Rfl:    clonazePAM (KLONOPIN) 0.5 MG tablet, Take 0.25 mg by mouth daily., Disp: , Rfl:    clotrimazole -betamethasone  (LOTRISONE ) cream, Apply 1 Application topically daily., Disp: 30 g, Rfl: 0   nortriptyline (PAMELOR) 10 MG capsule, Take 20 mg by mouth 2 (two) times daily., Disp: , Rfl:    pantoprazole  (PROTONIX ) 40 MG tablet, Take 1 tablet (40 mg total) by mouth 2 (two) times daily before a meal., Disp: 60 tablet, Rfl: 6   Medications ordered in this encounter:  No orders of the defined types were placed in this encounter.    *If you need refills on other medications prior to your next appointment, please contact your pharmacy*  Follow-Up: Call back or seek an in-person evaluation if the symptoms worsen or if the condition fails to improve as anticipated.   Virtual Care [phone]  Other Instructions Paronychia Paronychia is an infection of the skin. It happens near a fingernail or toenail. It may cause pain and swelling around the nail. In some cases, a fluid-filled bump (abscess) can form near or under the nail. Often, this condition is not serious, and it clears up with treatment. What are the  causes? This condition may be caused by a germ. The germ may be bacteria or a fungus. These germs can enter the body through an opening in the skin, such as a cut or a hangnail. Other causes include: Repeated injuries to your fingernails or toenails. Irritation of the base and sides of the nail (cuticle). What increases the risk? This condition is more likely to develop in people who: Get their hands wet often, such as a dishwasher. Bite their fingernails or the base and sides of their nails. Have other skin problems. Have hangnails or hurt fingertips. Come into contact with chemicals like detergents. Have diabetes. What are the signs or symptoms? Redness and swelling of the skin near the nail. A tender feeling around the nail. Pus-filled bumps under the skin at the base and sides of the nail. Fluid or pus under the nail. Pain in the area. How is this treated? Treatment depends on the cause of your condition and how bad it is. If your condition is mild, it may clear up on its own in a few days or after soaking in warm water. If needed, treatment may include: Antibiotic medicine. Antifungal medicine. A procedure to drain pus from a fluid-filled bump. Medicine to treat irritation and swelling (corticosteroids). Taking off part of an ingrown toenail. A bandage (dressing) may be placed over the nail area. Follow these instructions at home: Wound care Keep the affected area clean. Soak the fingers or toes in warm water as told by your doctor. You may be told to do this for 20 minutes, 2-3  times a day. Keep the area dry when you are not soaking it. Do not try to drain a fluid-filled bump on your own. Follow instructions from your doctor about how to take care of the affected area. Make sure you: Wash your hands with soap and water for at least 20 seconds before and after you change your bandage. If you cannot use soap and water, use hand sanitizer. Change your bandage as told by your  doctor. If you had a fluid-filled bump and your doctor drained it, check the area every day for signs of infection. Check for: Redness, swelling, or pain. Fluid or blood. Warmth. Pus or a bad smell. Medicines  Take over-the-counter and prescription medicines only as told by your doctor. If you were prescribed an antibiotic medicine, take it as told by your doctor. Do not stop taking it even if you start to feel better. General instructions Avoid contact with anything that irritates your skin or that you are allergic to. Do not pick at the affected area. Keep all follow-up visits. Prevention To prevent this condition from happening again: Wear rubber gloves when putting your hands in water for washing dishes or other tasks. Wear gloves if your hands might touch cleaners or chemicals. Avoid injuring your nails or fingertips. Do not bite your nails or tear hangnails. Do not cut your nails very short. Do not cut the skin at the base and sides of the nail. Use clean nail clippers or scissors when trimming nails. Contact a doctor if: You feel worse. You do not get better. You keep having or you have more fluid, blood, or pus coming from the affected area. Your affected finger, toe, or joint gets swollen or hard to move. You have a fever or chills. There is redness spreading from the affected area. Summary Paronychia is an infection of the skin. It happens near a fingernail or toenail. This condition may cause pain and swelling around the nail. Soak the fingers or toes in warm water as told by your doctor. Often, this condition is not serious, and it clears up with treatment. This information is not intended to replace advice given to you by your health care provider. Make sure you discuss any questions you have with your health care provider. Document Revised: 10/25/2020 Document Reviewed: 10/25/2020 Elsevier Patient Education  2024 [employer]   If you have been instructed to  have an in-person evaluation today at a local Urgent Care facility, please use the link below. It will take you to a list of all of our available [facility], including address, phone number and hours of operation. Please do not delay care.  [facility]  If you or a family member do not have a primary care provider, use the link below to schedule a visit and establish care. When you choose a [facility] primary care physician or advanced practice provider, you gain a long-term partner in health. Find a Primary Care Provider  Learn more about [facility] in-office and virtual care options: [facility] - Get Care Now

## 2024-07-22 NOTE — Progress Notes (Signed)
 Virtual Visit Consent   Ruth Haley, you are scheduled for a virtual visit with a [facility] provider today. Just as with appointments in the office, your consent must be obtained to participate. Your consent will be active for this visit and any virtual visit you may have with one of our providers in the next 365 days. If you have a MyChart account, a copy of this consent can be sent to you electronically.  As this is a virtual visit, video technology does not allow for your provider to perform a traditional examination. This may limit your provider's ability to fully assess your condition. If your provider identifies any concerns that need to be evaluated in person or the need to arrange testing (such as labs, EKG, etc.), we will make arrangements to do so. Although advances in technology are sophisticated, we cannot ensure that it will always work on either your end or our end. If the connection with a video visit is poor, the visit may have to be switched to a telephone visit. With either a video or telephone visit, we are not always able to ensure that we have a secure connection.  By engaging in this virtual visit, you consent to the provision of healthcare and authorize for your insurance to be billed (if applicable) for the services provided during this visit. Depending on your insurance coverage, you may receive a charge related to this service.  I need to obtain your verbal consent now. Are you willing to proceed with your visit today? Ruth Haley has provided verbal consent on 07/22/2024 for a virtual visit (video or telephone). Ruth Haley, [place]  Date: 07/22/2024 1:32 PM   Virtual Visit via Video Note   I, Ruth Haley, connected with  Ruth Haley  ([id number], 09-May-1991) on 07/22/2024 at 10:30 AM EST by a video-enabled telemedicine application and verified that I am speaking with the correct person using two identifiers.  Location: Patient: Virtual Visit Location Patient:  Home Provider: Virtual Visit Location Provider: Home Office   I discussed the limitations of evaluation and management by telemedicine and the availability of in person appointments. The patient expressed understanding and agreed to proceed.    History of Present Illness: Ruth Haley is a 33 y.o. who identifies as a female who was assigned female at birth, and is being seen today for redness of her R great toe over the past two weeks. Initially with redness, warmth, pain and drainage after skating. Notes drainage for a few days. Kept the area clean and dry, use of peroxide. Notes area is still red but without drainage. Denies any pain. Slight tenderness when pushing against the area. Denies fever, chills.   HPI: HPI  Problems:  Patient Active Problem List   Diagnosis Date Noted   Polyarthralgia 12/08/2023   Tenderness of lumbar region 12/06/2023   Body mass index (BMI) 40.0-44.9, adult (HCC) 07/13/2023   Gastroesophageal reflux disease 07/12/2023   Irritable bowel syndrome with both constipation and diarrhea 07/12/2023   Psychogenic nonepileptic seizure 07/12/2023   Migraine with aura and without status migrainosus, not intractable 07/12/2023   Primary dysmenorrhea 07/12/2023   Menorrhagia with regular cycle 07/12/2023   Heart murmur    Conversion disorder with attacks or seizures, acute episode, 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  Resting comfortably  at home.  Head is normocephalic, atraumatic.  No labored breathing.  Speech is clear  and coherent with logical content.  Patient is alert and oriented at baseline.  Mild erythema around nail bed (medially and laterally) of R great toe without active drainage or fluctuance.   Assessment and Plan: 1. Paronychia of great toe of right foot (Primary) - mupirocin  ointment  (BACTROBAN ) 2 %; Apply 1 Application topically 2 (two) times daily.  Dispense: 22 g; Refill: 0  Seems like it is resolving after initial drainage. Only slight tenderness to palpation only. No alarm sign or symptoms. Supportive measures and OTC medications reviewed. Bactroban  topically per orders. Follow-up for any non-resolving, new or worsening symptoms.   Follow Up Instructions: I discussed the assessment and treatment plan with the patient. The patient was provided an opportunity to ask questions and all were answered. The patient agreed with the plan and demonstrated an understanding of the instructions.  A copy of instructions were sent to the patient via MyChart unless otherwise noted below.   The patient was advised to call back or seek an in-person evaluation if the symptoms worsen or if the condition fails to improve as anticipated.    Ruth Velma Lunger, PA-C     [1]  Allergies Allergen Reactions   Morphine  And Codeine     Feels like brain is on fire   Adhesive [Tape]   [2]  Current Outpatient Medications:    mupirocin  ointment (BACTROBAN ) 2 %, Apply 1 Application topically 2 (two) times daily., Disp: 22 g, Rfl: 0   clobetasol ointment (TEMOVATE) 0.05 %, SMARTSIG:1-4 Gram(s) Topical Twice Daily, Disp: , Rfl:    clonazePAM (KLONOPIN) 0.5 MG tablet, Take 0.25 mg by mouth daily., Disp: , Rfl:    clotrimazole -betamethasone  (LOTRISONE ) cream, Apply 1 Application topically daily., Disp: 30 g, Rfl: 0   nortriptyline (PAMELOR) 10 MG capsule, Take 20 mg by mouth 2 (two) times daily., Disp: , Rfl:    pantoprazole  (PROTONIX ) 40 MG tablet, Take 1 tablet (40 mg total) by mouth 2 (two) times daily before a meal., Disp: 60 tablet, Rfl: 6
